# Patient Record
Sex: Female | Born: 1950 | Race: White | Hispanic: No | Smoking: Former smoker
Health system: Southern US, Community
[De-identification: ages and names within clinical notes are randomized; demographics above are authoritative.]

## PROBLEM LIST (undated history)

## (undated) DIAGNOSIS — A64 Unspecified sexually transmitted disease: Secondary | ICD-10-CM

## (undated) DIAGNOSIS — M1990 Unspecified osteoarthritis, unspecified site: Secondary | ICD-10-CM

## (undated) DIAGNOSIS — I350 Nonrheumatic aortic (valve) stenosis: Secondary | ICD-10-CM

## (undated) DIAGNOSIS — R32 Unspecified urinary incontinence: Secondary | ICD-10-CM

## (undated) DIAGNOSIS — B009 Herpesviral infection, unspecified: Secondary | ICD-10-CM

## (undated) DIAGNOSIS — R011 Cardiac murmur, unspecified: Secondary | ICD-10-CM

## (undated) DIAGNOSIS — K219 Gastro-esophageal reflux disease without esophagitis: Secondary | ICD-10-CM

## (undated) DIAGNOSIS — T7840XA Allergy, unspecified, initial encounter: Secondary | ICD-10-CM

## (undated) DIAGNOSIS — Z9189 Other specified personal risk factors, not elsewhere classified: Secondary | ICD-10-CM

## (undated) DIAGNOSIS — E119 Type 2 diabetes mellitus without complications: Secondary | ICD-10-CM

## (undated) DIAGNOSIS — I10 Essential (primary) hypertension: Secondary | ICD-10-CM

## (undated) DIAGNOSIS — C801 Malignant (primary) neoplasm, unspecified: Secondary | ICD-10-CM

## (undated) HISTORY — DX: Unspecified urinary incontinence: R32

## (undated) HISTORY — DX: Malignant (primary) neoplasm, unspecified: C80.1

## (undated) HISTORY — DX: Type 2 diabetes mellitus without complications: E11.9

## (undated) HISTORY — DX: Unspecified sexually transmitted disease: A64

## (undated) HISTORY — PX: COSMETIC SURGERY: SHX468

## (undated) HISTORY — DX: Unspecified osteoarthritis, unspecified site: M19.90

## (undated) HISTORY — DX: Allergy, unspecified, initial encounter: T78.40XA

## (undated) HISTORY — DX: Other specified personal risk factors, not elsewhere classified: Z91.89

## (undated) HISTORY — PX: CARDIAC CATHETERIZATION: SHX172

## (undated) HISTORY — PX: SKIN CANCER EXCISION: SHX779

## (undated) HISTORY — DX: Essential (primary) hypertension: I10

## (undated) HISTORY — DX: Nonrheumatic aortic (valve) stenosis: I35.0

## (undated) HISTORY — DX: Gastro-esophageal reflux disease without esophagitis: K21.9

## (undated) HISTORY — DX: Cardiac murmur, unspecified: R01.1

## (undated) HISTORY — DX: Herpesviral infection, unspecified: B00.9

## (undated) HISTORY — PX: COLONOSCOPY: SHX174

---

## 1978-07-16 HISTORY — PX: OTHER SURGICAL HISTORY: SHX169

## 1982-11-20 HISTORY — PX: PELVIC LAPAROSCOPY: SHX162

## 1991-11-24 HISTORY — PX: ENDOMETRIAL BIOPSY: SHX622

## 1993-11-20 HISTORY — PX: CYSTOSCOPY W/ DILATION OF BLADDER: SUR374

## 1998-07-19 ENCOUNTER — Other Ambulatory Visit: Admission: RE | Admit: 1998-07-19 | Discharge: 1998-07-19 | Payer: Self-pay | Admitting: Obstetrics and Gynecology

## 1999-08-01 ENCOUNTER — Other Ambulatory Visit: Admission: RE | Admit: 1999-08-01 | Discharge: 1999-08-01 | Payer: Self-pay | Admitting: Obstetrics and Gynecology

## 1999-12-05 ENCOUNTER — Encounter (INDEPENDENT_AMBULATORY_CARE_PROVIDER_SITE_OTHER): Payer: Self-pay | Admitting: Specialist

## 1999-12-05 ENCOUNTER — Other Ambulatory Visit: Admission: RE | Admit: 1999-12-05 | Discharge: 1999-12-05 | Payer: Self-pay | Admitting: Obstetrics and Gynecology

## 2000-07-31 ENCOUNTER — Other Ambulatory Visit: Admission: RE | Admit: 2000-07-31 | Discharge: 2000-07-31 | Payer: Self-pay | Admitting: Obstetrics and Gynecology

## 2001-08-06 ENCOUNTER — Other Ambulatory Visit: Admission: RE | Admit: 2001-08-06 | Discharge: 2001-08-06 | Payer: Self-pay | Admitting: Obstetrics and Gynecology

## 2002-08-18 ENCOUNTER — Other Ambulatory Visit: Admission: RE | Admit: 2002-08-18 | Discharge: 2002-08-18 | Payer: Self-pay | Admitting: Obstetrics and Gynecology

## 2003-11-03 ENCOUNTER — Other Ambulatory Visit: Admission: RE | Admit: 2003-11-03 | Discharge: 2003-11-03 | Payer: Self-pay | Admitting: Obstetrics and Gynecology

## 2004-02-15 ENCOUNTER — Encounter (INDEPENDENT_AMBULATORY_CARE_PROVIDER_SITE_OTHER): Payer: Self-pay | Admitting: Specialist

## 2004-02-15 ENCOUNTER — Ambulatory Visit (HOSPITAL_COMMUNITY): Admission: RE | Admit: 2004-02-15 | Discharge: 2004-02-15 | Payer: Self-pay | Admitting: *Deleted

## 2004-04-11 ENCOUNTER — Encounter: Admission: RE | Admit: 2004-04-11 | Discharge: 2004-04-11 | Payer: Self-pay | Admitting: Orthopedic Surgery

## 2004-12-06 ENCOUNTER — Other Ambulatory Visit: Admission: RE | Admit: 2004-12-06 | Discharge: 2004-12-06 | Payer: Self-pay | Admitting: Obstetrics and Gynecology

## 2005-12-12 ENCOUNTER — Other Ambulatory Visit: Admission: RE | Admit: 2005-12-12 | Discharge: 2005-12-12 | Payer: Self-pay | Admitting: Obstetrics and Gynecology

## 2007-01-09 ENCOUNTER — Other Ambulatory Visit: Admission: RE | Admit: 2007-01-09 | Discharge: 2007-01-09 | Payer: Self-pay | Admitting: Obstetrics and Gynecology

## 2008-01-22 ENCOUNTER — Other Ambulatory Visit: Admission: RE | Admit: 2008-01-22 | Discharge: 2008-01-22 | Payer: Self-pay | Admitting: Obstetrics and Gynecology

## 2009-02-15 ENCOUNTER — Encounter: Payer: Self-pay | Admitting: Obstetrics and Gynecology

## 2009-02-15 ENCOUNTER — Other Ambulatory Visit: Admission: RE | Admit: 2009-02-15 | Discharge: 2009-02-15 | Payer: Self-pay | Admitting: Obstetrics and Gynecology

## 2009-02-15 ENCOUNTER — Ambulatory Visit: Payer: Self-pay | Admitting: Obstetrics and Gynecology

## 2009-11-01 ENCOUNTER — Encounter: Admission: RE | Admit: 2009-11-01 | Discharge: 2009-11-01 | Payer: Self-pay | Admitting: Internal Medicine

## 2010-02-16 ENCOUNTER — Other Ambulatory Visit: Admission: RE | Admit: 2010-02-16 | Discharge: 2010-02-16 | Payer: Self-pay | Admitting: Obstetrics and Gynecology

## 2010-02-16 ENCOUNTER — Ambulatory Visit: Payer: Self-pay | Admitting: Obstetrics and Gynecology

## 2010-03-31 ENCOUNTER — Ambulatory Visit: Payer: Self-pay | Admitting: Obstetrics and Gynecology

## 2011-02-20 ENCOUNTER — Other Ambulatory Visit: Payer: Self-pay | Admitting: Obstetrics and Gynecology

## 2011-02-20 ENCOUNTER — Other Ambulatory Visit (HOSPITAL_COMMUNITY)
Admission: RE | Admit: 2011-02-20 | Discharge: 2011-02-20 | Disposition: A | Discharge status: Home / Self Care | Payer: BC Managed Care – PPO | Source: Ambulatory Visit | Attending: Obstetrics and Gynecology | Admitting: Obstetrics and Gynecology

## 2011-02-20 ENCOUNTER — Encounter (INDEPENDENT_AMBULATORY_CARE_PROVIDER_SITE_OTHER): Payer: BC Managed Care – PPO | Admitting: Obstetrics and Gynecology

## 2011-02-20 DIAGNOSIS — Z01419 Encounter for gynecological examination (general) (routine) without abnormal findings: Secondary | ICD-10-CM

## 2011-02-20 DIAGNOSIS — Z124 Encounter for screening for malignant neoplasm of cervix: Secondary | ICD-10-CM | POA: Insufficient documentation

## 2011-02-23 DIAGNOSIS — Z1211 Encounter for screening for malignant neoplasm of colon: Secondary | ICD-10-CM

## 2011-04-07 NOTE — Op Note (Signed)
NAME:  Mary Reed, Mary Reed                      ACCOUNT NO.:  [id number]   MEDICAL RECORD NO.:  [id number]                   PATIENT TYPE:  AMB   LOCATION:  ENDO                                 FACILITY:  [facility]   PHYSICIAN:  Georgiana Spinner, M.D.                 DATE OF BIRTH:  05/09/1951   DATE OF PROCEDURE:  DATE OF DISCHARGE:                                 OPERATIVE REPORT   PROCEDURE:  Upper endoscopy.   INDICATIONS:  GERD with dysphagia.   ANESTHESIA:  Demerol 60 mg, Versed 6 mg.   DESCRIPTION OF PROCEDURE:  With the patient mildly sedated in the left  lateral decubitus position, the Olympus videoscopic endoscope was inserted  in the mouth and passed under direct vision through the esophagus, which  appeared normal until it reached the distal esophagus which appeared mildly  inflamed.  This was photographed and biopsied.  We entered  into the  stomach.  The  fundus, body, antrum, duodenal bulb, and second portion of  the duodenum were visualized.  From this point, the endoscope was slowly  withdrawn taking circumferential views of the duodenal mucosa until the  endoscope was pulled back into the stomach, placed in retroflexion and  viewed the stomach from below.  The endoscope was then straightened,  withdrawn, taking circumferential views of the remaining gastric and  esophageal mucosa.  The patient's vital signs and pulse oximetry remained  stable.  The patient tolerated the procedure well without apparent  complication.   FINDINGS:  Distal esophagus inflamed, biopsied.   PLAN:  Since the patient's dysphagia is improving on the PPI, we will  continue that for the time being and have the patient follow up with me as  an outpatient.  Proceed to colonoscopy as planned.                                               Georgiana Spinner, M.D.    GMO/MEDQ  D:  02/15/2004  T:  02/15/2004  Job:  [id number]   cc:   Kingsley Callander. Ouida Sills, M.D.  [address]  [place]  [place] [address]  Fax: [phone]

## 2011-04-07 NOTE — Op Note (Signed)
NAME:  Mary Reed, Mary Reed                      ACCOUNT NO.:  [id number]   MEDICAL RECORD NO.:  [id number]                   PATIENT TYPE:  AMB   LOCATION:  ENDO                                 FACILITY:  [facility]   PHYSICIAN:  Georgiana Spinner, M.D.                 DATE OF BIRTH:  [birth year]   DATE OF PROCEDURE:  02/15/2004  DATE OF DISCHARGE:                                 OPERATIVE REPORT   PROCEDURE:  Colonoscopy.   INDICATIONS:  Rectal bleeding.   ANESTHESIA:  Demerol 20 mg.   DESCRIPTION OF PROCEDURE:  With the patient mildly sedated in the left  lateral decubitus position, a rectal exam was performed which was  unremarkable.  Subsequently, the Olympus videoscopic colonoscope was  inserted in the rectum, passed under direct vision to the cecum, identified  by the ileocecal valve and base of the cecum, both of which were  photographed.  From this point, the colonoscope was slowly withdrawn, taking  circumferential views of the colonic mucosa, stopping only then to  photograph the rectum which appeared normal on direct and showed hemorrhoids  on retroflexed view.  The endoscope was straightened and withdrawn.  The  patient's vital signs and pulse oximeter remained stable.  The patient  tolerated the procedure well without apparent complications.   FINDINGS:  1. Internal hemorrhoids.  2. Thickening of the sigmoid colon due to diverticular disease.  3. Otherwise, an unremarkable colonoscopic examination.   PLAN:  1. Increase the fiber in the patient's diet.  2. Have patient follow up with me, as described above in endoscopy note.                                               Georgiana Spinner, M.D.    GMO/MEDQ  D:  02/15/2004  T:  02/15/2004  Job:  [id number]   cc:   Reuel Boom L. Eda Paschal, M.D.  [address], [address]  [place]  [place] [address]  Fax: [phone]

## 2012-02-15 DIAGNOSIS — B009 Herpesviral infection, unspecified: Secondary | ICD-10-CM | POA: Insufficient documentation

## 2012-02-15 DIAGNOSIS — I10 Essential (primary) hypertension: Secondary | ICD-10-CM | POA: Insufficient documentation

## 2012-02-26 ENCOUNTER — Encounter: Payer: Self-pay | Admitting: Obstetrics and Gynecology

## 2012-02-26 ENCOUNTER — Other Ambulatory Visit (HOSPITAL_COMMUNITY)
Admission: RE | Admit: 2012-02-26 | Discharge: 2012-02-26 | Disposition: A | Discharge status: Home / Self Care | Payer: BC Managed Care – PPO | Source: Ambulatory Visit | Attending: Obstetrics and Gynecology | Admitting: Obstetrics and Gynecology

## 2012-02-26 ENCOUNTER — Ambulatory Visit (INDEPENDENT_AMBULATORY_CARE_PROVIDER_SITE_OTHER): Payer: BC Managed Care – PPO | Admitting: Obstetrics and Gynecology

## 2012-02-26 VITALS — BP 120/76 | Ht 69.0 in | Wt 188.0 lb

## 2012-02-26 DIAGNOSIS — Z01419 Encounter for gynecological examination (general) (routine) without abnormal findings: Secondary | ICD-10-CM

## 2012-02-26 NOTE — Progress Notes (Signed)
Patient came to see me today for her annual GYN exam. She has scheduled her mammogram. She has had 3 normal bone densities. She is not having significant menopausal symptoms. She does her lab work elsewhere. She is having no vaginal bleeding. She is having no pelvic pain.  HEENT: Within normal limits. Kennon Portela present. Neck: No masses. Supraclavicular lymph nodes: Not enlarged. Breasts: Examined in both sitting and lying position. Symmetrical without skin changes or masses. Abdomen: Soft no masses guarding or rebound. No hernias. Pelvic: External within normal limits. BUS within normal limits. Vaginal examination shows good estrogen effect, no cystocele enterocele or rectocele. Cervix: clean. Uterus: normal size and shape. Adnexa within normal limits. Rectovaginal confirmatory. Extremities within normal limits.  Assessment: Normal GYN exam  Plan: Mammogram.

## 2012-02-27 LAB — URINALYSIS W MICROSCOPIC + REFLEX CULTURE
Bacteria, UA: NONE SEEN
Bilirubin Urine: NEGATIVE
Casts: NONE SEEN
Crystals: NONE SEEN
Glucose, UA: NEGATIVE mg/dL
Hgb urine dipstick: NEGATIVE
Ketones, ur: NEGATIVE mg/dL
Nitrite: NEGATIVE
Protein, ur: NEGATIVE mg/dL
Specific Gravity, Urine: 1.011 (ref 1.005–1.030)
Squamous Epithelial / LPF: NONE SEEN
Urobilinogen, UA: 0.2 mg/dL (ref 0.0–1.0)
pH: 7 (ref 5.0–8.0)

## 2012-02-28 LAB — URINE CULTURE: Colony Count: 15000

## 2012-03-29 ENCOUNTER — Encounter: Payer: Self-pay | Admitting: Obstetrics and Gynecology

## 2013-03-05 ENCOUNTER — Encounter: Payer: Self-pay | Admitting: Gynecology

## 2013-03-26 ENCOUNTER — Encounter: Payer: Self-pay | Admitting: Gynecology

## 2013-03-26 ENCOUNTER — Other Ambulatory Visit (HOSPITAL_COMMUNITY)
Admission: RE | Admit: 2013-03-26 | Discharge: 2013-03-26 | Disposition: A | Discharge status: Home / Self Care | Payer: BC Managed Care – PPO | Source: Ambulatory Visit | Attending: Gynecology | Admitting: Gynecology

## 2013-03-26 ENCOUNTER — Ambulatory Visit (INDEPENDENT_AMBULATORY_CARE_PROVIDER_SITE_OTHER): Payer: BC Managed Care – PPO | Admitting: Gynecology

## 2013-03-26 VITALS — BP 146/90 | Ht 68.75 in | Wt 190.0 lb

## 2013-03-26 DIAGNOSIS — Z23 Encounter for immunization: Secondary | ICD-10-CM

## 2013-03-26 DIAGNOSIS — Z01419 Encounter for gynecological examination (general) (routine) without abnormal findings: Secondary | ICD-10-CM | POA: Insufficient documentation

## 2013-03-26 DIAGNOSIS — Z91B Personal risk factor of exposure to diethylstilbestrol: Secondary | ICD-10-CM | POA: Insufficient documentation

## 2013-03-26 DIAGNOSIS — E119 Type 2 diabetes mellitus without complications: Secondary | ICD-10-CM | POA: Insufficient documentation

## 2013-03-26 DIAGNOSIS — N952 Postmenopausal atrophic vaginitis: Secondary | ICD-10-CM

## 2013-03-26 DIAGNOSIS — N959 Unspecified menopausal and perimenopausal disorder: Secondary | ICD-10-CM

## 2013-03-26 DIAGNOSIS — Z9189 Other specified personal risk factors, not elsewhere classified: Secondary | ICD-10-CM

## 2013-03-26 DIAGNOSIS — N882 Stricture and stenosis of cervix uteri: Secondary | ICD-10-CM

## 2013-03-26 DIAGNOSIS — R635 Abnormal weight gain: Secondary | ICD-10-CM

## 2013-03-26 DIAGNOSIS — Z1151 Encounter for screening for human papillomavirus (HPV): Secondary | ICD-10-CM | POA: Insufficient documentation

## 2013-03-26 MED ORDER — VALACYCLOVIR HCL 500 MG PO TABS: ORAL_TABLET | ORAL | Status: DC

## 2013-03-26 NOTE — Progress Notes (Signed)
Mary Reed 08/06/1951 [id number]   History:    62 y.o.  for annual gyn exam with no complaints today. Review of patient's records and after speaking with her she was a DES daughter. Her mother had received a DES to prevent preterm delivery during her pregnancy. Patient has had normal Pap smears in the past. Her primary physician Dr. Renne Crigler has been doing her lab work in recently was diagnosed with type 2 diabetes and currently on metformin twice a day. Patient has received her shingles vaccine in the past but has not had to Tdap vaccine. Her mammogram in 2013 was normal but was dense. Her colonoscopy is overdue has been 10 years. She reports that last colonoscopy was normal.  Past medical history,surgical history, family history and social history were all reviewed and documented in the EPIC chart.  Gynecologic History No LMP recorded. Patient is postmenopausal. Contraception: post menopausal status Last Pap: 2013. Results were: normal Last mammogram: 2013. Results were: normal but since  Obstetric History OB History   Grav Para Term Preterm Abortions TAB SAB Ect Mult Living   2 2 2       2      # Outc Date GA Lbr Len/2nd Wgt Sex Del Anes PTL Lv   1 TRM            2 TRM                ROS: A ROS was performed and pertinent positives and negatives are included in the history.  GENERAL: No fevers or chills. HEENT: No change in vision, no earache, sore throat or sinus congestion. NECK: No pain or stiffness. CARDIOVASCULAR: No chest pain or pressure. No palpitations. PULMONARY: No shortness of breath, cough or wheeze. GASTROINTESTINAL: No abdominal pain, nausea, vomiting or diarrhea, melena or bright red blood per rectum. GENITOURINARY: No urinary frequency, urgency, hesitancy or dysuria. MUSCULOSKELETAL: No joint or muscle pain, no back pain, no recent trauma. DERMATOLOGIC: No rash, no itching, no lesions. ENDOCRINE: No polyuria, polydipsia, no heat or cold intolerance. No recent change  in weight. HEMATOLOGICAL: No anemia or easy bruising or bleeding. NEUROLOGIC: No headache, seizures, numbness, tingling or weakness. PSYCHIATRIC: No depression, no loss of interest in normal activity or change in sleep pattern.     Exam: chaperone present  BP 146/90  Ht 5' 8.75" (1.746 m)  Wt 190 lb (86.183 kg)  BMI 28.27 kg/m2  Body mass index is 28.27 kg/(m^2).  General appearance : Well developed well nourished female. No acute distress HEENT: Neck supple, trachea midline, no carotid bruits, no thyroidmegaly Lungs: Clear to auscultation, no rhonchi or wheezes, or rib retractions  Heart: Regular rate and rhythm, no murmurs or gallops Breast:Examined in sitting and supine position were symmetrical in appearance, no palpable masses or tenderness,  no skin retraction, no nipple inversion, no nipple discharge, no skin discoloration, no axillary or supraclavicular lymphadenopathy Abdomen: no palpable masses or tenderness, no rebound or guarding Extremities: no edema or skin discoloration or tenderness  Pelvic:  Bartholin, Urethra, Skene Glands: Within normal limits             Vagina: No gross lesions or discharge, atrophic changes  Cervix: No gross lesions or discharge, stenotic os  Uterus  anteverted, normal size, shape and consistency, non-tender and mobile  Adnexa  Without masses or tenderness  Anus and perineum  normal   Rectovaginal  normal sphincter tone without palpated masses or tenderness  Hemoccult card provided     Assessment/Plan:  61 y.o. 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 We discussed the new Pap smear guidelines. Because of her history we will do annual Pap smears. Due to the fact that patient's cervix was narrow after her Pap smear was obtained Betadine solution was applied. A paracervical block with 1% lidocaine was infiltrated into the cervical block at the 12,2, 4 and 8 position. Small wire dilators were used to dilate the cervical canal the  endocervical brush was utilized to get adequate sampling of the endocervical canal attached to the Pap smear. Patient was given the name of one of my colleagues Gastroenterologist for her to schedule an appointment. Hemoccult cards were provided. Patient will receive a Tdap vaccine today.patient with schedule her bone density study in the next few weeks. We discussed importance of calcium vitamin D and regular exercise for osteoporosis prevention. Patient was reminded to schedule her mammogram at which time it is recommended she have a three-dimensional due to her dense breasts.    Ok Edwards MD, 10:38 AM 03/26/2013

## 2013-03-26 NOTE — Patient Instructions (Addendum)
Exercise to Lose Weight Exercise and a healthy diet may help you lose weight. Your doctor may suggest specific exercises. EXERCISE IDEAS AND TIPS  Choose low-cost things you enjoy doing, such as walking, bicycling, or exercising to workout videos.   Take stairs instead of the elevator.   Walk during your lunch break.   Park your car further away from work or school.   Go to a gym or an exercise class.   Start with 5 to 10 minutes of exercise each day. Build up to 30 minutes of exercise 4 to 6 days a week.   Wear shoes with good support and comfortable clothes.   Stretch before and after working out.   Work out until you breathe harder and your heart beats faster.   Drink extra water when you exercise.   Do not do so much that you hurt yourself, feel dizzy, or get very short of breath.  Exercises that burn about 150 calories:  Running 1  miles in 15 minutes.   Playing volleyball for 45 to 60 minutes.   Washing and waxing a car for 45 to 60 minutes.   Playing touch football for 45 minutes.   Walking 1  miles in 35 minutes.   Pushing a stroller 1  miles in 30 minutes.   Playing basketball for 30 minutes.   Raking leaves for 30 minutes.   Bicycling 5 miles in 30 minutes.   Walking 2 miles in 30 minutes.   Dancing for 30 minutes.   Shoveling snow for 15 minutes.   Swimming laps for 20 minutes.   Walking up stairs for 15 minutes.   Bicycling 4 miles in 15 minutes.   Gardening for 30 to 45 minutes.   Jumping rope for 15 minutes.   Washing windows or floors for 45 to 60 minutes.  Document Released: 12/09/2010 Document Revised: 07/19/2011 Document Reviewed: 12/09/2010 [facility] Patient Information 2012 [place].                                          Patient information: High cholesterol (The Basics)  What is cholesterol? - Cholesterol is a substance that is found in the blood. Everyone has some. It is needed for good health. The problem is, people  sometimes have too much cholesterol. Compared with people with normal cholesterol, people with high cholesterol have a higher risk of heart attacks, strokes, and other health problems. The higher your cholesterol, the higher your risk of these problems. Cholesterol levels in your body are determined significantly by your diet. Cholesterol levels may also be related to heart disease. The following material helps to explain this relationship and discusses what you can do to help keep your heart healthy. Not all cholesterol is bad. Low-density lipoprotein (LDL) cholesterol is the "bad" cholesterol. It may cause fatty deposits to build up inside your arteries. High-density lipoprotein (HDL) cholesterol is "good." It helps to remove the "bad" LDL cholesterol from your blood. Cholesterol is a very important risk factor for heart disease. Other risk factors are high blood pressure, smoking, stress, heredity, and weight.  The heart muscle gets its supply of blood through the coronary arteries. If your LDL cholesterol is high and your HDL cholesterol is low, you are at risk for having fatty deposits build up in your coronary arteries. This leaves less room through which blood can flow. Without sufficient blood and  oxygen, the heart muscle cannot function properly and you may feel chest pains (angina pectoris). When a coronary artery closes up entirely, a part of the heart muscle may die, causing a heart attack (myocardial infarction).  CHECKING CHOLESTEROL When your caregiver sends your blood to a lab to be analyzed for cholesterol, a complete lipid (fat) profile may be done. With this test, the total amount of cholesterol and levels of LDL and HDL are determined. Triglycerides are a type of fat that circulates in the blood and can also be used to determine heart disease risk. Are there different types of cholesterol? - Yes, there are a few different types. If you get a cholesterol test, you may hear your doctor or  nurse talk about: Total cholesterol  LDL cholesterol - Some people call this the "bad" cholesterol. That's because having high LDL levels raises your risk of heart attacks, strokes, and other health problems.  HDL cholesterol - Some people call this the "good" cholesterol. That's because having high HDL levels lowers your risk of heart attacks, strokes, and other health problems.  Non-HDL cholesterol - Non-HDL cholesterol is your total cholesterol minus your HDL cholesterol.  Triglycerides - Triglycerides are not cholesterol. They are a type of fat. But they often get measured when cholesterol is measured. (Having high triglycerides also seems to increase the risk of heart attacks and strokes.)   Keep in mind, though, that many people who cannot meet these goals still have a low risk of heart attacks and strokes. What should I do if my doctor tells me I have high cholesterol? - Ask your doctor what your overall risk of heart attacks and strokes is. High cholesterol, by itself, is not always a reason to worry. Having high cholesterol is just one of many things that can increase your risk of heart attacks and strokes. Other factors that increase your risk include:  Cigarette smoking  High blood pressure  Having a parent, sister, or brother who got heart disease at a young age (Young, in this case, means younger than 55 for men and younger than 65 for women.)  Being a man (Women are at risk, too, but men have a higher risk.)  Older age  If you are at high risk of heart attacks and strokes, having high cholesterol is a problem. On the other hand, if you have are at low risk, having high cholesterol may not mean much. Should I take medicine to lower cholesterol? - Not everyone who has high cholesterol needs medicines. Your doctor or nurse will decide if you need them based on your age, family history, and other health concerns.  You should probably take a cholesterol-lowering medicine called a statin if  you: Already had a heart attack or stroke  Have known heart disease  Have diabetes  Have a condition called peripheral artery disease, which makes it painful to walk, and happens when the arteries in your legs get clogged with fatty deposits  Have an abdominal aortic aneurysm, which is a widening of the main artery in the belly  Most people with any of the conditions listed above should take a statin no matter what their cholesterol level is. If your doctor or nurse puts you on a statin, stay on it. The medicine may not make you feel any different. But it can help prevent heart attacks, strokes, and death.  Can I lower my cholesterol without medicines? - Yes, you can lower your cholesterol some by:  Avoiding red meat, butter,   fried foods, cheese, and other foods that have a lot of saturated fat  Losing weight (if you are overweight)  Being more active Even if these steps do little to change your cholesterol, they can improve your health in many ways.                                                   Cholesterol Control Diet  CONTROLLING CHOLESTEROL WITH DIET Although exercise and lifestyle factors are important, your diet is key. That is because certain foods are known to raise cholesterol and others to lower it. The goal is to balance foods for their effect on cholesterol and more importantly, to replace saturated and trans fat with other types of fat, such as monounsaturated fat, polyunsaturated fat, and omega-3 fatty acids. On average, a person should consume no more than 15 to 17 g of saturated fat daily. Saturated and trans fats are considered "bad" fats, and they will raise LDL cholesterol. Saturated fats are primarily found in animal products such as meats, butter, and cream. However, that does not mean you need to sacrifice all your favorite foods. Today, there are good tasting, low-fat, low-cholesterol substitutes for most of the things you like to eat. Choose low-fat or nonfat  alternatives. Choose round or loin cuts of red meat, since these types of cuts are lowest in fat and cholesterol. Chicken (without the skin), fish, veal, and ground [place] breast are excellent choices. Eliminate fatty meats, such as hot dogs and salami. Even shellfish have little or no saturated fat. Have a 3 oz (85 g) portion when you eat lean meat, poultry, or fish. Trans fats are also called "partially hydrogenated oils." They are oils that have been scientifically manipulated so that they are solid at room temperature resulting in a longer shelf life and improved taste and texture of foods in which they are added. Trans fats are found in stick margarine, some tub margarines, cookies, crackers, and baked goods.  When baking and cooking, oils are an excellent substitute for butter. The monounsaturated oils are especially beneficial since it is believed they lower LDL and raise HDL. The oils you should avoid entirely are saturated tropical oils, such as coconut and palm.  Remember to eat liberally from food groups that are naturally free of saturated and trans fat, including fish, fruit, vegetables, beans, grains (barley, rice, couscous, bulgur wheat), and pasta (without cream sauces).  IDENTIFYING FOODS THAT LOWER CHOLESTEROL  Soluble fiber may lower your cholesterol. This type of fiber is found in fruits such as apples, vegetables such as broccoli, potatoes, and carrots, legumes such as beans, peas, and lentils, and grains such as barley. Foods fortified with plant sterols (phytosterol) may also lower cholesterol. You should eat at least 2 g per day of these foods for a cholesterol lowering effect.  Read package labels to identify low-saturated fats, trans fats free, and low-fat foods at the supermarket. Select cheeses that have only 2 to 3 g saturated fat per ounce. Use a heart-healthy tub margarine that is free of trans fats or partially hydrogenated oil. When buying baked goods (cookies, crackers), avoid  partially hydrogenated oils. Breads and muffins should be made from whole grains (whole-wheat or whole oat flour, instead of "flour" or "enriched flour"). Buy non-creamy canned soups with reduced salt and no added fats.  FOOD PREPARATION TECHNIQUES  Never deep-fry. If you must fry, either stir-fry, which uses very little fat, or use non-stick cooking sprays. When possible, broil, bake, or roast meats, and steam vegetables. Instead of dressing vegetables with butter or margarine, use lemon and herbs, applesauce and cinnamon (for squash and sweet potatoes), nonfat yogurt, salsa, and low-fat dressings for salads.  LOW-SATURATED FAT / LOW-FAT FOOD SUBSTITUTES Meats / Saturated Fat (g)  Avoid: Steak, marbled (3 oz/85 g) / 11 g   Choose: Steak, lean (3 oz/85 g) / 4 g   Avoid: Hamburger (3 oz/85 g) / 7 g   Choose: Hamburger, lean (3 oz/85 g) / 5 g   Avoid: Ham (3 oz/85 g) / 6 g   Choose: Ham, lean cut (3 oz/85 g) / 2.4 g   Avoid: Chicken, with skin, dark meat (3 oz/85 g) / 4 g   Choose: Chicken, skin removed, dark meat (3 oz/85 g) / 2 g   Avoid: Chicken, with skin, light meat (3 oz/85 g) / 2.5 g   Choose: Chicken, skin removed, light meat (3 oz/85 g) / 1 g  Dairy / Saturated Fat (g)  Avoid: Whole milk (1 cup) / 5 g   Choose: Low-fat milk, 2% (1 cup) / 3 g   Choose: Low-fat milk, 1% (1 cup) / 1.5 g   Choose: Skim milk (1 cup) / 0.3 g   Avoid: Hard cheese (1 oz/28 g) / 6 g   Choose: Skim milk cheese (1 oz/28 g) / 2 to 3 g   Avoid: Cottage cheese, 4% fat (1 cup) / 6.5 g   Choose: Low-fat cottage cheese, 1% fat (1 cup) / 1.5 g   Avoid: Ice cream (1 cup) / 9 g   Choose: Sherbet (1 cup) / 2.5 g   Choose: Nonfat frozen yogurt (1 cup) / 0.3 g   Choose: Frozen fruit bar / trace   Avoid: Whipped cream (1 tbs) / 3.5 g   Choose: Nondairy whipped topping (1 tbs) / 1 g  Condiments / Saturated Fat (g)  Avoid: Mayonnaise (1 tbs) / 2 g   Choose: Low-fat mayonnaise (1 tbs) / 1 g    Avoid: Butter (1 tbs) / 7 g   Choose: Extra light margarine (1 tbs) / 1 g   Avoid: Coconut oil (1 tbs) / 11.8 g   Choose: Olive oil (1 tbs) / 1.8 g   Choose: Corn oil (1 tbs) / 1.7 g   Choose: Safflower oil (1 tbs) / 1.2 g   Choose: Sunflower oil (1 tbs) / 1.4 g   Choose: Soybean oil (1 tbs) / 2.4 g   Choose: Canola oil (1 tbs) / 1 g   Tetanus, Diphtheria, Pertussis (Tdap) Vaccine What You Need to Know WHY GET VACCINATED? Tetanus, diphtheria and pertussis can be very serious diseases, even for adolescents and adults. Tdap vaccine can protect [place] from these diseases. TETANUS (Lockjaw) causes painful muscle tightening and stiffness, usually all over the body.  It can lead to tightening of muscles in the head and neck so you can't open your mouth, swallow, or sometimes even breathe. Tetanus kills about 1 out of 5 people who are infected. DIPHTHERIA can cause a thick coating to form in the back of the throat.  It can lead to breathing problems, paralysis, heart failure, and death. PERTUSSIS (Whooping Cough) causes severe coughing spells, which can cause difficulty breathing, vomiting and disturbed sleep.  It can also lead to weight loss, incontinence, and rib fractures. Up to  2 in 100 adolescents and 5 in 100 adults with pertussis are hospitalized or have complications, which could include pneumonia and death. These diseases are caused by bacteria. Diphtheria and pertussis are spread from person to person through coughing or sneezing. Tetanus enters the body through cuts, scratches, or wounds. Before vaccines, the [place] saw as many as 200,000 cases a year of diphtheria and pertussis, and hundreds of cases of tetanus. Since vaccination began, tetanus and diphtheria have dropped by about 99% and pertussis by about 80%. TDAP VACCINE Tdap vaccine can protect adolescents and adults from tetanus, diphtheria, and pertussis. One dose of Tdap is routinely given at age 78 or 64. People  who did not get Tdap at that age should get it as soon as possible. Tdap is especially important for health care professionals and anyone having close contact with a baby younger than 12 months. Pregnant women should get a dose of Tdap during every pregnancy, to protect the newborn from pertussis. Infants are most at risk for severe, life-threatening complications from pertussis. A similar vaccine, called Td, protects from tetanus and diphtheria, but not pertussis. A Td booster should be given every 10 years. Tdap may be given as one of these boosters if you have not already gotten a dose. Tdap may also be given after a severe cut or burn to prevent tetanus infection. Your doctor can give you more information. Tdap may safely be given at the same time as other vaccines. SOME PEOPLE SHOULD NOT GET THIS VACCINE  If you ever had a life-threatening allergic reaction after a dose of any tetanus, diphtheria, or pertussis containing vaccine, OR if you have a severe allergy to any part of this vaccine, you should not get Tdap. Tell your doctor if you have any severe allergies.  If you had a coma, or long or multiple seizures within 7 days after a childhood dose of DTP or DTaP, you should not get Tdap, unless a cause other than the vaccine was found. You can still get Td.  Talk to your doctor if you:  have epilepsy or another nervous system problem,  had severe pain or swelling after any vaccine containing diphtheria, tetanus or pertussis,  ever had Guillain-Barr Syndrome (GBS),  aren't feeling well on the day the shot is scheduled. RISKS OF A VACCINE REACTION With any medicine, including vaccines, there is a chance of side effects. These are usually mild and go away on their own, but serious reactions are also possible. Brief fainting spells can follow a vaccination, leading to injuries from falling. Sitting or lying down for about 15 minutes can help prevent these. Tell your doctor if you feel dizzy  or light-headed, or have vision changes or ringing in the ears. Mild problems following Tdap (Did not interfere with activities)  Pain where the shot was given (about 3 in 4 adolescents or 2 in 3 adults)  Redness or swelling where the shot was given (about 1 person in 5)  Mild fever of at least 100.71F (up to about 1 in 25 adolescents or 1 in 100 adults)  Headache (about 3 or 4 people in 10)  Tiredness (about 1 person in 3 or 4)  Nausea, vomiting, diarrhea, stomach ache (up to 1 in 4 adolescents or 1 in 10 adults)  Chills, body aches, sore joints, rash, swollen glands (uncommon) Moderate problems following Tdap (Interfered with activities, but did not require medical attention)  Pain where the shot was given (about 1 in 5 adolescents or  1 in 100 adults)  Redness or swelling where the shot was given (up to about 1 in 16 adolescents or 1 in 25 adults)  Fever over 102F (about 1 in 100 adolescents or 1 in 250 adults)  Headache (about 3 in 20 adolescents or 1 in 10 adults)  Nausea, vomiting, diarrhea, stomach ache (up to 1 or 3 people in 100)  Swelling of the entire arm where the shot was given (up to about 3 in 100). Severe problems following Tdap (Unable to perform usual activities, required medical attention)  Swelling, severe pain, bleeding and redness in the arm where the shot was given (rare). A severe allergic reaction could occur after any vaccine (estimated less than 1 in a million doses). WHAT IF THERE IS A SERIOUS REACTION? What should I look for?  Look for anything that concerns you, such as signs of a severe allergic reaction, very high fever, or behavior changes. Signs of a severe allergic reaction can include hives, swelling of the face and throat, difficulty breathing, a fast heartbeat, dizziness, and weakness. These would start a few minutes to a few hours after the vaccination. What should I do?  If you think it is a severe allergic reaction or other emergency  that can't wait, call 9-1-1 or get the person to the nearest hospital. Otherwise, call your doctor.  Afterward, the reaction should be reported to the "Vaccine Adverse Event Reporting System" (VAERS). Your doctor might file this report, or you can do it yourself through the VAERS web site at www.vaers.LAgents.no, or by calling 1-3081513457. VAERS is only for reporting reactions. They do not give medical advice.  THE NATIONAL VACCINE INJURY COMPENSATION PROGRAM The National Vaccine Injury Compensation Program (VICP) is a federal program that was created to compensate people who may have been injured by certain vaccines. Persons who believe they may have been injured by a vaccine can learn about the program and about filing a claim by calling 1-7093955384 or visiting the VICP website at SpiritualWord.at. HOW CAN I LEARN MORE?  Ask your doctor.  Call your local or state health department.  Contact the Centers for Disease Control and Prevention (CDC):  Call 956-648-5509 or visit CDC's website at PicCapture.uy CDC Tdap Vaccine VIS (03/28/12) Document Released: 05/07/2012 Document Reviewed: 05/07/2012 [facility] Patient Information 2013 [place], [place].

## 2013-03-26 NOTE — Addendum Note (Signed)
Addended by: Bertram Savin A on: 03/26/2013 10:53 AM   Modules accepted: Orders

## 2013-03-27 ENCOUNTER — Encounter: Payer: Self-pay | Admitting: Obstetrics and Gynecology

## 2013-04-02 ENCOUNTER — Encounter: Payer: Self-pay | Admitting: Gynecology

## 2013-07-23 DIAGNOSIS — Z1211 Encounter for screening for malignant neoplasm of colon: Secondary | ICD-10-CM

## 2013-07-24 ENCOUNTER — Other Ambulatory Visit: Payer: BC Managed Care – PPO | Admitting: Anesthesiology

## 2013-07-24 DIAGNOSIS — Z1211 Encounter for screening for malignant neoplasm of colon: Secondary | ICD-10-CM

## 2013-09-25 ENCOUNTER — Other Ambulatory Visit: Payer: Self-pay

## 2014-03-16 ENCOUNTER — Encounter: Payer: Self-pay | Admitting: Obstetrics and Gynecology

## 2014-03-16 ENCOUNTER — Telehealth: Payer: Self-pay | Admitting: Obstetrics and Gynecology

## 2014-03-16 NOTE — Telephone Encounter (Signed)
lmtcb re: new rs'd appointment for same day, different time with Dr. Quincy Simmonds.

## 2014-04-08 ENCOUNTER — Encounter: Payer: BC Managed Care – PPO | Admitting: Obstetrics and Gynecology

## 2014-04-08 ENCOUNTER — Encounter: Payer: Self-pay | Admitting: Obstetrics and Gynecology

## 2014-04-08 ENCOUNTER — Ambulatory Visit (INDEPENDENT_AMBULATORY_CARE_PROVIDER_SITE_OTHER): Payer: BC Managed Care – PPO | Admitting: Obstetrics and Gynecology

## 2014-04-08 VITALS — BP 138/80 | HR 76 | Ht 68.25 in | Wt 185.0 lb

## 2014-04-08 DIAGNOSIS — Z01419 Encounter for gynecological examination (general) (routine) without abnormal findings: Secondary | ICD-10-CM

## 2014-04-08 DIAGNOSIS — Z1211 Encounter for screening for malignant neoplasm of colon: Secondary | ICD-10-CM

## 2014-04-08 DIAGNOSIS — Z9189 Other specified personal risk factors, not elsewhere classified: Secondary | ICD-10-CM

## 2014-04-08 DIAGNOSIS — Z0000 Encounter for general adult medical examination without abnormal findings: Secondary | ICD-10-CM

## 2014-04-08 LAB — POCT URINALYSIS DIPSTICK
Bilirubin, UA: NEGATIVE
Blood, UA: NEGATIVE
Glucose, UA: NEGATIVE
Ketones, UA: NEGATIVE
Leukocytes, UA: NEGATIVE
Nitrite, UA: NEGATIVE
Protein, UA: NEGATIVE
Urobilinogen, UA: NEGATIVE
pH, UA: 5

## 2014-04-08 NOTE — Patient Instructions (Signed)
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
## 2014-04-10 LAB — IPS PAP SMEAR ONLY

## 2014-04-10 LAB — IPS PAP TEST WITH HPV

## 2014-04-28 LAB — FECAL OCCULT BLOOD, IMMUNOCHEMICAL: IFOBT: NEGATIVE

## 2014-09-21 ENCOUNTER — Encounter: Payer: Self-pay | Admitting: Obstetrics and Gynecology

## 2015-04-21 ENCOUNTER — Ambulatory Visit (INDEPENDENT_AMBULATORY_CARE_PROVIDER_SITE_OTHER): Payer: Medicare HMO | Admitting: Obstetrics and Gynecology

## 2015-04-21 ENCOUNTER — Encounter: Payer: Self-pay | Admitting: Obstetrics and Gynecology

## 2015-04-21 VITALS — BP 140/84 | HR 64 | Resp 16 | Ht 68.0 in | Wt 189.4 lb

## 2015-04-21 DIAGNOSIS — Z01419 Encounter for gynecological examination (general) (routine) without abnormal findings: Secondary | ICD-10-CM

## 2015-04-21 MED ORDER — NYSTATIN 100000 UNIT/GM EX POWD: Freq: Three times a day (TID) | CUTANEOUS | Status: DC
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
## 2015-04-24 LAB — IPS PAP SMEAR ONLY

## 2015-04-26 ENCOUNTER — Ambulatory Visit: Payer: Self-pay | Admitting: Certified Nurse Midwife

## 2015-05-19 ENCOUNTER — Ambulatory Visit: Payer: Self-pay | Admitting: Sports Medicine

## 2015-12-14 ENCOUNTER — Other Ambulatory Visit: Payer: Self-pay | Admitting: Internal Medicine

## 2015-12-14 DIAGNOSIS — I6529 Occlusion and stenosis of unspecified carotid artery: Secondary | ICD-10-CM

## 2015-12-20 ENCOUNTER — Ambulatory Visit
Admission: RE | Admit: 2015-12-20 | Discharge: 2015-12-20 | Disposition: A | Discharge status: Home / Self Care | Payer: Commercial Managed Care - HMO | Source: Ambulatory Visit | Attending: Internal Medicine | Admitting: Internal Medicine

## 2015-12-20 DIAGNOSIS — I6529 Occlusion and stenosis of unspecified carotid artery: Secondary | ICD-10-CM

## 2016-04-24 ENCOUNTER — Ambulatory Visit (INDEPENDENT_AMBULATORY_CARE_PROVIDER_SITE_OTHER): Payer: Commercial Managed Care - HMO | Admitting: Obstetrics and Gynecology

## 2016-04-24 ENCOUNTER — Encounter: Payer: Self-pay | Admitting: Obstetrics and Gynecology

## 2016-04-24 VITALS — BP 122/68 | HR 60 | Resp 18 | Ht 69.0 in | Wt 189.6 lb

## 2016-04-24 DIAGNOSIS — Z01419 Encounter for gynecological examination (general) (routine) without abnormal findings: Secondary | ICD-10-CM | POA: Diagnosis not present

## 2016-04-24 MED ORDER — VALACYCLOVIR HCL 500 MG PO TABS: ORAL_TABLET | ORAL | Status: DC
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
## 2016-04-24 NOTE — Patient Instructions (Signed)
Health Maintenance, Female Adopting a healthy lifestyle and getting preventive care can go a long way to promote health and wellness. Talk with your health care Mary Reed about what schedule of regular examinations is right for you. This is a good chance for you to check in with your Mary Reed about disease prevention and staying healthy. In between checkups, there are plenty of things you can do on your own. Experts have done a lot of research about which lifestyle changes and preventive measures are most likely to keep you healthy. Ask your health care Mary Reed for more information. WEIGHT AND DIET  Eat a healthy diet  Be sure to include plenty of vegetables, fruits, low-fat dairy products, and lean protein.  Do not eat a lot of foods high in solid fats, added sugars, or salt.  Get regular exercise. This is one of the most important things you can do for your health.  Most adults should exercise for at least 150 minutes each week. The exercise should increase your heart rate and make you sweat (moderate-intensity exercise).  Most adults should also do strengthening exercises at least twice a week. This is in addition to the moderate-intensity exercise.  Maintain a healthy weight  Body mass index (BMI) is a measurement that can be used to identify possible weight problems. It estimates body fat based on height and weight. Your health care Mary Reed can help determine your BMI and help you achieve or maintain a healthy weight.  For females 20 years of age and older:   A BMI below 18.5 is considered underweight.  A BMI of 18.5 to 24.9 is normal.  A BMI of 25 to 29.9 is considered overweight.  A BMI of 30 and above is considered obese.  Watch levels of cholesterol and blood lipids  You should start having your blood tested for lipids and cholesterol at 67 years of age, then have this test every 5 years.  You may need to have your cholesterol levels checked more often if:  Your lipid  or cholesterol levels are high.  You are older than 67 years of age.  You are at high risk for heart disease.  CANCER SCREENING   Lung Cancer  Lung cancer screening is recommended for adults 55-80 years old who are at high risk for lung cancer because of a history of smoking.  A yearly low-dose CT scan of the lungs is recommended for people who:  Currently smoke.  Have quit within the past 15 years.  Have at least a 30-pack-year history of smoking. A pack year is smoking an average of one pack of cigarettes a day for 1 year.  Yearly screening should continue until it has been 15 years since you quit.  Yearly screening should stop if you develop a health problem that would prevent you from having lung cancer treatment.  Breast Cancer  Practice breast self-awareness. This means understanding how your breasts normally appear and feel.  It also means doing regular breast self-exams. Let your health care Mary Reed know about any changes, no matter how small.  If you are in your 20s or 30s, you should have a clinical breast exam (CBE) by a health care Mary Reed every 1-3 years as part of a regular health exam.  If you are 40 or older, have a CBE every year. Also consider having a breast X-ray (mammogram) every year.  If you have a family history of breast cancer, talk to your health care Mary Reed about genetic screening.  If you   are at high risk for breast cancer, talk to your health care Mary Reed about having an MRI and a mammogram every year.  Breast cancer gene (BRCA) assessment is recommended for women who have family members with BRCA-related cancers. BRCA-related cancers include:  Breast.  Ovarian.  Tubal.  Peritoneal cancers.  Results of the assessment will determine the need for genetic counseling and BRCA1 and BRCA2 testing. Cervical Cancer Your health care Mary Reed may recommend that you be screened regularly for cancer of the pelvic organs (ovaries, uterus, and  vagina). This screening involves a pelvic examination, including checking for microscopic changes to the surface of your cervix (Pap test). You may be encouraged to have this screening done every 3 years, beginning at age 21.  For women ages 30-65, health care providers may recommend pelvic exams and Pap testing every 3 years, or they may recommend the Pap and pelvic exam, combined with testing for human papilloma virus (HPV), every 5 years. Some types of HPV increase your risk of cervical cancer. Testing for HPV may also be done on women of any age with unclear Pap test results.  Other health care providers may not recommend any screening for nonpregnant women who are considered low risk for pelvic cancer and who do not have symptoms. Ask your health care Mary Reed if a screening pelvic exam is right for you.  If you have had past treatment for cervical cancer or a condition that could lead to cancer, you need Pap tests and screening for cancer for at least 20 years after your treatment. If Pap tests have been discontinued, your risk factors (such as having a new sexual partner) need to be reassessed to determine if screening should resume. Some women have medical problems that increase the chance of getting cervical cancer. In these cases, your health care Mary Reed may recommend more frequent screening and Pap tests. Colorectal Cancer  This type of cancer can be detected and often prevented.  Routine colorectal cancer screening usually begins at 67 years of age and continues through 67 years of age.  Your health care Mary Reed may recommend screening at an earlier age if you have risk factors for colon cancer.  Your health care Mary Reed may also recommend using home test kits to check for hidden blood in the stool.  A small camera at the end of a tube can be used to examine your colon directly (sigmoidoscopy or colonoscopy). This is done to check for the earliest forms of colorectal  cancer.  Routine screening usually begins at age 50.  Direct examination of the colon should be repeated every 5-10 years through 67 years of age. However, you may need to be screened more often if early forms of precancerous polyps or small growths are found. Skin Cancer  Check your skin from head to toe regularly.  Tell your health care Mary Reed about any new moles or changes in moles, especially if there is a change in a mole's shape or color.  Also tell your health care Mary Reed if you have a mole that is larger than the size of a pencil eraser.  Always use sunscreen. Apply sunscreen liberally and repeatedly throughout the day.  Protect yourself by wearing long sleeves, pants, a wide-brimmed hat, and sunglasses whenever you are outside. HEART DISEASE, DIABETES, AND HIGH BLOOD PRESSURE   High blood pressure causes heart disease and increases the risk of stroke. High blood pressure is more likely to develop in:  People who have blood pressure in the high end   of the normal range (130-139/85-89 mm Hg).  People who are overweight or obese.  People who are African American.  If you are 38-23 years of age, have your blood pressure checked every 3-5 years. If you are 61 years of age or older, have your blood pressure checked every year. You should have your blood pressure measured twice--once when you are at a hospital or clinic, and once when you are not at a hospital or clinic. Record the average of the two measurements. To check your blood pressure when you are not at a hospital or clinic, you can use:  An automated blood pressure machine at a pharmacy.  A home blood pressure monitor.  If you are between 45 years and 39 years old, ask your health care Coumba Kellison if you should take aspirin to prevent strokes.  Have regular diabetes screenings. This involves taking a blood sample to check your fasting blood sugar level.  If you are at a normal weight and have a low risk for diabetes,  have this test once every three years after 68 years of age.  If you are overweight and have a high risk for diabetes, consider being tested at a younger age or more often. PREVENTING INFECTION  Hepatitis B  If you have a higher risk for hepatitis B, you should be screened for this virus. You are considered at high risk for hepatitis B if:  You were born in a country where hepatitis B is common. Ask your health care Velvet Moomaw which countries are considered high risk.  Your parents were born in a high-risk country, and you have not been immunized against hepatitis B (hepatitis B vaccine).  You have HIV or AIDS.  You use needles to inject street drugs.  You live with someone who has hepatitis B.  You have had sex with someone who has hepatitis B.  You get hemodialysis treatment.  You take certain medicines for conditions, including cancer, organ transplantation, and autoimmune conditions. Hepatitis C  Blood testing is recommended for:  Everyone born from [birth year] through 1967.  Anyone with known risk factors for hepatitis C. Sexually transmitted infections (STIs)  You should be screened for sexually transmitted infections (STIs) including gonorrhea and chlamydia if:  You are sexually active and are younger than 67 years of age.  You are older than 67 years of age and your health care Mary Reed tells you that you are at risk for this type of infection.  Your sexual activity has changed since you were last screened and you are at an increased risk for chlamydia or gonorrhea. Ask your health care Mary Reed if you are at risk.  If you do not have HIV, but are at risk, it may be recommended that you take a prescription medicine daily to prevent HIV infection. This is called pre-exposure prophylaxis (PrEP). You are considered at risk if:  You are sexually active and do not regularly use condoms or know the HIV status of your partner(s).  You take drugs by injection.  You are sexually  active with a partner who has HIV. Talk with your health care Mary Reed about whether you are at high risk of being infected with HIV. If you choose to begin PrEP, you should first be tested for HIV. You should then be tested every 3 months for as long as you are taking PrEP.  PREGNANCY   If you are premenopausal and you may become pregnant, ask your health care Mary Reed about preconception counseling.  If you may  become pregnant, take 400 to 800 micrograms (mcg) of folic acid every day.  If you want to prevent pregnancy, talk to your health care Mary Reed Boldin about birth control (contraception). OSTEOPOROSIS AND MENOPAUSE   Osteoporosis is a disease in which the bones lose minerals and strength with aging. This can result in serious bone fractures. Your risk for osteoporosis can be identified using a bone density scan.  If you are 61 years of age or older, or if you are at risk for osteoporosis and fractures, ask your health care Jammy Plotkin if you should be screened.  Ask your health care Jenavee Laguardia whether you should take a calcium or vitamin D supplement to lower your risk for osteoporosis.  Menopause may have certain physical symptoms and risks.  Hormone replacement therapy may reduce some of these symptoms and risks. Talk to your health care Renzo Vincelette about whether hormone replacement therapy is right for you.  HOME CARE INSTRUCTIONS   Schedule regular health, dental, and eye exams.  Stay current with your immunizations.   Do not use any tobacco products including cigarettes, chewing tobacco, or electronic cigarettes.  If you are pregnant, do not drink alcohol.  If you are breastfeeding, limit how much and how often you drink alcohol.  Limit alcohol intake to no more than 1 drink per day for nonpregnant women. One drink equals 12 ounces of beer, 5 ounces of wine, or 1 ounces of hard liquor.  Do not use street drugs.  Do not share needles.  Ask your health care Beauregard Jarrells for help if  you need support or information about quitting drugs.  Tell your health care Shamon Cothran if you often feel depressed.  Tell your health care Blakely Maranan if you have ever been abused or do not feel safe at home.   This information is not intended to replace advice given to you by your health care Shauntay Brunelli. Make sure you discuss any questions you have with your health care Riker Collier.   Document Released: 05/22/2011 Document Revised: 11/27/2014 Document Reviewed: 10/08/2013 Elsevier Interactive Patient Education [employer].

## 2016-04-27 LAB — IPS PAP SMEAR ONLY

## 2016-05-17 ENCOUNTER — Ambulatory Visit: Payer: Medicare HMO | Admitting: Obstetrics and Gynecology

## 2016-06-06 ENCOUNTER — Encounter: Payer: Self-pay | Admitting: Obstetrics and Gynecology

## 2016-12-13 DIAGNOSIS — E118 Type 2 diabetes mellitus with unspecified complications: Secondary | ICD-10-CM | POA: Diagnosis not present

## 2016-12-13 DIAGNOSIS — E7800 Pure hypercholesterolemia, unspecified: Secondary | ICD-10-CM | POA: Diagnosis not present

## 2016-12-18 DIAGNOSIS — E559 Vitamin D deficiency, unspecified: Secondary | ICD-10-CM | POA: Diagnosis not present

## 2016-12-18 DIAGNOSIS — Z0000 Encounter for general adult medical examination without abnormal findings: Secondary | ICD-10-CM | POA: Diagnosis not present

## 2016-12-18 DIAGNOSIS — R69 Illness, unspecified: Secondary | ICD-10-CM | POA: Diagnosis not present

## 2016-12-18 DIAGNOSIS — I10 Essential (primary) hypertension: Secondary | ICD-10-CM | POA: Diagnosis not present

## 2017-02-12 DIAGNOSIS — E559 Vitamin D deficiency, unspecified: Secondary | ICD-10-CM | POA: Diagnosis not present

## 2017-02-12 DIAGNOSIS — E118 Type 2 diabetes mellitus with unspecified complications: Secondary | ICD-10-CM | POA: Diagnosis not present

## 2017-02-12 DIAGNOSIS — Z79899 Other long term (current) drug therapy: Secondary | ICD-10-CM | POA: Diagnosis not present

## 2017-02-13 DIAGNOSIS — E559 Vitamin D deficiency, unspecified: Secondary | ICD-10-CM | POA: Diagnosis not present

## 2017-02-13 DIAGNOSIS — I10 Essential (primary) hypertension: Secondary | ICD-10-CM | POA: Diagnosis not present

## 2017-02-13 DIAGNOSIS — E7800 Pure hypercholesterolemia, unspecified: Secondary | ICD-10-CM | POA: Diagnosis not present

## 2017-02-13 DIAGNOSIS — E119 Type 2 diabetes mellitus without complications: Secondary | ICD-10-CM | POA: Diagnosis not present

## 2017-04-23 DIAGNOSIS — E119 Type 2 diabetes mellitus without complications: Secondary | ICD-10-CM | POA: Diagnosis not present

## 2017-04-23 DIAGNOSIS — H524 Presbyopia: Secondary | ICD-10-CM | POA: Diagnosis not present

## 2017-05-01 DIAGNOSIS — K219 Gastro-esophageal reflux disease without esophagitis: Secondary | ICD-10-CM | POA: Diagnosis not present

## 2017-05-01 DIAGNOSIS — R0989 Other specified symptoms and signs involving the circulatory and respiratory systems: Secondary | ICD-10-CM | POA: Diagnosis not present
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
## 2017-05-09 ENCOUNTER — Ambulatory Visit (INDEPENDENT_AMBULATORY_CARE_PROVIDER_SITE_OTHER): Payer: Medicare HMO | Admitting: Obstetrics and Gynecology

## 2017-05-09 ENCOUNTER — Other Ambulatory Visit (HOSPITAL_COMMUNITY)
Admission: RE | Admit: 2017-05-09 | Discharge: 2017-05-09 | Disposition: A | Discharge status: Home / Self Care | Payer: Medicare HMO | Source: Ambulatory Visit | Attending: Obstetrics and Gynecology | Admitting: Obstetrics and Gynecology

## 2017-05-09 ENCOUNTER — Encounter: Payer: Self-pay | Admitting: Obstetrics and Gynecology

## 2017-05-09 VITALS — BP 100/60 | HR 84 | Resp 16 | Ht 68.0 in | Wt 183.0 lb

## 2017-05-09 DIAGNOSIS — Z01419 Encounter for gynecological examination (general) (routine) without abnormal findings: Secondary | ICD-10-CM

## 2017-05-09 DIAGNOSIS — Z124 Encounter for screening for malignant neoplasm of cervix: Secondary | ICD-10-CM | POA: Diagnosis not present

## 2017-05-09 NOTE — Patient Instructions (Signed)

## 2017-05-10 LAB — CYTOLOGY - PAP: Diagnosis: NEGATIVE

## 2017-05-14 DIAGNOSIS — E559 Vitamin D deficiency, unspecified: Secondary | ICD-10-CM | POA: Diagnosis not present

## 2017-05-14 DIAGNOSIS — E119 Type 2 diabetes mellitus without complications: Secondary | ICD-10-CM | POA: Diagnosis not present

## 2017-05-14 DIAGNOSIS — I10 Essential (primary) hypertension: Secondary | ICD-10-CM | POA: Diagnosis not present

## 2017-05-15 DIAGNOSIS — E559 Vitamin D deficiency, unspecified: Secondary | ICD-10-CM | POA: Diagnosis not present

## 2017-05-15 DIAGNOSIS — I10 Essential (primary) hypertension: Secondary | ICD-10-CM | POA: Diagnosis not present

## 2017-05-15 DIAGNOSIS — E119 Type 2 diabetes mellitus without complications: Secondary | ICD-10-CM | POA: Diagnosis not present

## 2017-05-28 DIAGNOSIS — K219 Gastro-esophageal reflux disease without esophagitis: Secondary | ICD-10-CM | POA: Diagnosis not present

## 2017-05-28 DIAGNOSIS — I10 Essential (primary) hypertension: Secondary | ICD-10-CM | POA: Diagnosis not present

## 2017-06-05 DIAGNOSIS — Z1231 Encounter for screening mammogram for malignant neoplasm of breast: Secondary | ICD-10-CM | POA: Diagnosis not present

## 2017-07-25 DIAGNOSIS — J45909 Unspecified asthma, uncomplicated: Secondary | ICD-10-CM | POA: Diagnosis not present

## 2017-07-25 DIAGNOSIS — R062 Wheezing: Secondary | ICD-10-CM | POA: Diagnosis not present

## 2017-08-08 ENCOUNTER — Encounter: Payer: Self-pay | Admitting: Obstetrics and Gynecology

## 2017-08-13 DIAGNOSIS — E559 Vitamin D deficiency, unspecified: Secondary | ICD-10-CM | POA: Diagnosis not present

## 2017-08-13 DIAGNOSIS — E119 Type 2 diabetes mellitus without complications: Secondary | ICD-10-CM | POA: Diagnosis not present

## 2017-08-14 DIAGNOSIS — I10 Essential (primary) hypertension: Secondary | ICD-10-CM | POA: Diagnosis not present

## 2017-08-14 DIAGNOSIS — E559 Vitamin D deficiency, unspecified: Secondary | ICD-10-CM | POA: Diagnosis not present

## 2017-08-14 DIAGNOSIS — E118 Type 2 diabetes mellitus with unspecified complications: Secondary | ICD-10-CM | POA: Diagnosis not present

## 2017-08-22 DIAGNOSIS — R69 Illness, unspecified: Secondary | ICD-10-CM | POA: Diagnosis not present

## 2017-09-19 ENCOUNTER — Telehealth: Payer: Self-pay | Admitting: Obstetrics and Gynecology

## 2017-09-19 NOTE — Telephone Encounter (Signed)
Patient is having some abnormal bleeding. °

## 2017-09-19 NOTE — Telephone Encounter (Signed)
Spoke with patient. Patient states she has been experiencing bleeding when wiping for the last 10 days, unsure if r/t vagina or hemorrhoid. Postmenopausal.   Denies pain. States bowel movements go from constipation to diarrhea, strains at times.    Recommended OV for further evaluation, patient request to schedule OV on 11/12, recommended earlier appointment for evaluation. Patient scheduled for 09/24/17 at 2:30pm with Dr. Quincy Simmonds. Patient verbalizes understanding and is agreeable.   Routing to provider for final review. Patient is agreeable to disposition. Will close encounter.

## 2017-09-24 ENCOUNTER — Encounter: Payer: Self-pay | Admitting: Obstetrics and Gynecology

## 2017-09-24 ENCOUNTER — Ambulatory Visit: Payer: Medicare HMO | Admitting: Obstetrics and Gynecology

## 2017-09-24 VITALS — BP 124/70 | HR 74 | Resp 14 | Wt 191.0 lb

## 2017-09-24 DIAGNOSIS — N950 Postmenopausal bleeding: Secondary | ICD-10-CM

## 2017-09-24 DIAGNOSIS — K649 Unspecified hemorrhoids: Secondary | ICD-10-CM | POA: Diagnosis not present

## 2017-09-24 DIAGNOSIS — Z1211 Encounter for screening for malignant neoplasm of colon: Secondary | ICD-10-CM | POA: Diagnosis not present
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
## 2017-09-25 ENCOUNTER — Telehealth: Payer: Self-pay | Admitting: Obstetrics and Gynecology

## 2017-09-25 NOTE — Telephone Encounter (Signed)
Phone encounter reviewed and closed by me.

## 2017-09-25 NOTE — Telephone Encounter (Signed)
Spoke with patient regarding benefit for transabdominal only ultrasound. Patient understood and agreeable. Patient ready to schedule. Patient scheduled Monday, 10/01/17  with Dr Quincy Simmonds. Patient advised to drink 32 oz of water one hour prior to appointment, advising a full bladder is needed for this type of ultrasound. Patient understood and agreeable. Patient aware of appointment date, arrival time and cancellation policy. No further questions. Ok to close  Routing to Dr Quincy Simmonds for review

## 2017-10-01 ENCOUNTER — Encounter: Payer: Self-pay | Admitting: Obstetrics and Gynecology

## 2017-10-01 ENCOUNTER — Ambulatory Visit: Payer: Medicare HMO | Admitting: Obstetrics and Gynecology

## 2017-10-01 ENCOUNTER — Ambulatory Visit (INDEPENDENT_AMBULATORY_CARE_PROVIDER_SITE_OTHER): Payer: Medicare HMO

## 2017-10-01 ENCOUNTER — Telehealth: Payer: Self-pay

## 2017-10-01 VITALS — BP 154/82 | HR 70 | Ht 68.0 in | Wt 191.0 lb

## 2017-10-01 DIAGNOSIS — N859 Noninflammatory disorder of uterus, unspecified: Secondary | ICD-10-CM

## 2017-10-01 DIAGNOSIS — N950 Postmenopausal bleeding: Secondary | ICD-10-CM | POA: Diagnosis not present

## 2017-10-01 LAB — FECAL OCCULT BLOOD, IMMUNOCHEMICAL: IFOBT: NEGATIVE

## 2017-10-01 NOTE — Telephone Encounter (Signed)
Patient seen today for PUS and she brought in IFOB also. Read IFOB while patient was here and it was negative. Patient was informed. Routed to [place]. Patient rescheduled appointment to discuss PUS results with Dr.Silva on 10-08-17 3:30pm.

## 2017-10-01 NOTE — Progress Notes (Signed)
Encounter reviewed by Dr. Stormy Connon Amundson C. Silva.  

## 2017-10-01 NOTE — Telephone Encounter (Signed)
I reviewed this encounter, the IFOB, and closed it.

## 2017-10-03 ENCOUNTER — Telehealth: Payer: Self-pay | Admitting: *Deleted

## 2017-10-03 NOTE — Telephone Encounter (Signed)
Routing to Dr. Antony Blackbird and Deloris Ping D. To confirm precert of endometrial biopsy.   Notes recorded by Burnice Logan, RN on 10/03/2017 at 12:03 PM EST Spoke with patient, advised as seen below per Dr. Quincy Simmonds. Patient request to discuss results and recommendations further at [facility] on 10/08/17 before proceeding with EMB. Advised patient would update Dr. Quincy Simmonds. Patient verbalizes understanding. See telephone encounter dated 10/03/17 to review with provider. ------  Notes recorded by Nunzio Cobbs, MD on 10/03/2017 at 5:45 AM EST Please contact patient regarding her ultrasound results and planning for her next visit.  I reviewed the ultrasound she had done at the office in my absence.  Because the measurement of the lining of her uterus is just over 3 mm and because there is some fluid inside the cavity, and endometrial biopsy is recommended. She had some calcifications in the wall of the uterus, which is not of concern.  Her ovaries were normal.  I just want her to understand our plan for her follow up visit will be to sample the lining of her uterus.  This should have already been precerted.  She can take Motrin 800 mg prior to the office visit.

## 2017-10-03 NOTE — Telephone Encounter (Signed)
Encounter reviewed and closed.  

## 2017-10-08 ENCOUNTER — Ambulatory Visit (INDEPENDENT_AMBULATORY_CARE_PROVIDER_SITE_OTHER): Payer: Medicare HMO | Admitting: Obstetrics and Gynecology

## 2017-10-08 VITALS — BP 124/76 | HR 70 | Ht 68.0 in | Wt 191.0 lb

## 2017-10-08 DIAGNOSIS — N950 Postmenopausal bleeding: Secondary | ICD-10-CM | POA: Diagnosis not present
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
## 2017-10-08 NOTE — Patient Instructions (Signed)

## 2017-10-09 ENCOUNTER — Encounter: Payer: Self-pay | Admitting: Obstetrics and Gynecology

## 2017-11-05 DIAGNOSIS — E559 Vitamin D deficiency, unspecified: Secondary | ICD-10-CM | POA: Diagnosis not present

## 2017-11-05 DIAGNOSIS — E118 Type 2 diabetes mellitus with unspecified complications: Secondary | ICD-10-CM | POA: Diagnosis not present

## 2017-11-06 DIAGNOSIS — E119 Type 2 diabetes mellitus without complications: Secondary | ICD-10-CM | POA: Diagnosis not present

## 2017-11-06 DIAGNOSIS — E559 Vitamin D deficiency, unspecified: Secondary | ICD-10-CM | POA: Diagnosis not present

## 2018-01-01 DIAGNOSIS — E7800 Pure hypercholesterolemia, unspecified: Secondary | ICD-10-CM | POA: Diagnosis not present

## 2018-01-01 DIAGNOSIS — E118 Type 2 diabetes mellitus with unspecified complications: Secondary | ICD-10-CM | POA: Diagnosis not present

## 2018-01-01 DIAGNOSIS — E663 Overweight: Secondary | ICD-10-CM | POA: Diagnosis not present

## 2018-01-01 DIAGNOSIS — Z0000 Encounter for general adult medical examination without abnormal findings: Secondary | ICD-10-CM | POA: Diagnosis not present

## 2018-01-01 DIAGNOSIS — E559 Vitamin D deficiency, unspecified: Secondary | ICD-10-CM | POA: Diagnosis not present

## 2018-01-01 DIAGNOSIS — I10 Essential (primary) hypertension: Secondary | ICD-10-CM | POA: Diagnosis not present

## 2018-01-01 DIAGNOSIS — R011 Cardiac murmur, unspecified: Secondary | ICD-10-CM | POA: Diagnosis not present

## 2018-01-14 DIAGNOSIS — R0989 Other specified symptoms and signs involving the circulatory and respiratory systems: Secondary | ICD-10-CM | POA: Diagnosis not present

## 2018-01-14 DIAGNOSIS — E119 Type 2 diabetes mellitus without complications: Secondary | ICD-10-CM | POA: Diagnosis not present

## 2018-01-14 DIAGNOSIS — R011 Cardiac murmur, unspecified: Secondary | ICD-10-CM | POA: Diagnosis not present

## 2018-01-14 DIAGNOSIS — I10 Essential (primary) hypertension: Secondary | ICD-10-CM | POA: Diagnosis not present

## 2018-01-18 DIAGNOSIS — R011 Cardiac murmur, unspecified: Secondary | ICD-10-CM | POA: Diagnosis not present

## 2018-01-18 DIAGNOSIS — R0989 Other specified symptoms and signs involving the circulatory and respiratory systems: Secondary | ICD-10-CM | POA: Diagnosis not present

## 2018-01-28 DIAGNOSIS — R011 Cardiac murmur, unspecified: Secondary | ICD-10-CM | POA: Diagnosis not present

## 2018-01-28 DIAGNOSIS — I10 Essential (primary) hypertension: Secondary | ICD-10-CM | POA: Diagnosis not present

## 2018-01-28 DIAGNOSIS — R0602 Shortness of breath: Secondary | ICD-10-CM | POA: Diagnosis not present

## 2018-01-28 DIAGNOSIS — I6523 Occlusion and stenosis of bilateral carotid arteries: Secondary | ICD-10-CM | POA: Diagnosis not present

## 2018-02-04 DIAGNOSIS — E559 Vitamin D deficiency, unspecified: Secondary | ICD-10-CM | POA: Diagnosis not present

## 2018-02-04 DIAGNOSIS — E119 Type 2 diabetes mellitus without complications: Secondary | ICD-10-CM | POA: Diagnosis not present

## 2018-02-04 DIAGNOSIS — I10 Essential (primary) hypertension: Secondary | ICD-10-CM | POA: Diagnosis not present

## 2018-02-06 DIAGNOSIS — I10 Essential (primary) hypertension: Secondary | ICD-10-CM | POA: Diagnosis not present

## 2018-02-06 DIAGNOSIS — E119 Type 2 diabetes mellitus without complications: Secondary | ICD-10-CM | POA: Diagnosis not present

## 2018-02-11 DIAGNOSIS — I6523 Occlusion and stenosis of bilateral carotid arteries: Secondary | ICD-10-CM | POA: Diagnosis not present

## 2018-02-11 DIAGNOSIS — R0602 Shortness of breath: Secondary | ICD-10-CM | POA: Diagnosis not present

## 2018-02-12 DIAGNOSIS — E119 Type 2 diabetes mellitus without complications: Secondary | ICD-10-CM | POA: Diagnosis not present

## 2018-02-12 DIAGNOSIS — I10 Essential (primary) hypertension: Secondary | ICD-10-CM | POA: Diagnosis not present

## 2018-02-20 DIAGNOSIS — E559 Vitamin D deficiency, unspecified: Secondary | ICD-10-CM | POA: Diagnosis not present

## 2018-02-20 DIAGNOSIS — I10 Essential (primary) hypertension: Secondary | ICD-10-CM | POA: Diagnosis not present

## 2018-02-20 DIAGNOSIS — E119 Type 2 diabetes mellitus without complications: Secondary | ICD-10-CM | POA: Diagnosis not present

## 2018-03-05 DIAGNOSIS — R51 Headache: Secondary | ICD-10-CM | POA: Diagnosis not present

## 2018-03-05 DIAGNOSIS — J069 Acute upper respiratory infection, unspecified: Secondary | ICD-10-CM | POA: Diagnosis not present

## 2018-03-05 DIAGNOSIS — I10 Essential (primary) hypertension: Secondary | ICD-10-CM | POA: Diagnosis not present

## 2018-03-05 DIAGNOSIS — E7800 Pure hypercholesterolemia, unspecified: Secondary | ICD-10-CM | POA: Diagnosis not present

## 2018-03-18 DIAGNOSIS — R69 Illness, unspecified: Secondary | ICD-10-CM | POA: Diagnosis not present

## 2018-03-19 DIAGNOSIS — R69 Illness, unspecified: Secondary | ICD-10-CM | POA: Diagnosis not present

## 2018-04-29 DIAGNOSIS — E559 Vitamin D deficiency, unspecified: Secondary | ICD-10-CM | POA: Diagnosis not present

## 2018-04-29 DIAGNOSIS — E119 Type 2 diabetes mellitus without complications: Secondary | ICD-10-CM | POA: Diagnosis not present

## 2018-05-01 DIAGNOSIS — E559 Vitamin D deficiency, unspecified: Secondary | ICD-10-CM | POA: Diagnosis not present

## 2018-05-01 DIAGNOSIS — I10 Essential (primary) hypertension: Secondary | ICD-10-CM | POA: Diagnosis not present

## 2018-05-01 DIAGNOSIS — E118 Type 2 diabetes mellitus with unspecified complications: Secondary | ICD-10-CM | POA: Diagnosis not present

## 2018-05-15 ENCOUNTER — Ambulatory Visit: Payer: Medicare HMO | Admitting: Obstetrics and Gynecology

## 2018-06-05 DIAGNOSIS — M25511 Pain in right shoulder: Secondary | ICD-10-CM | POA: Diagnosis not present

## 2018-06-11 DIAGNOSIS — Z1231 Encounter for screening mammogram for malignant neoplasm of breast: Secondary | ICD-10-CM | POA: Diagnosis not present

## 2018-06-18 ENCOUNTER — Ambulatory Visit (INDEPENDENT_AMBULATORY_CARE_PROVIDER_SITE_OTHER): Payer: Medicare HMO | Admitting: Obstetrics and Gynecology

## 2018-06-18 ENCOUNTER — Encounter: Payer: Self-pay | Admitting: Obstetrics and Gynecology

## 2018-06-18 ENCOUNTER — Other Ambulatory Visit (HOSPITAL_COMMUNITY)
Admission: RE | Admit: 2018-06-18 | Discharge: 2018-06-18 | Disposition: A | Discharge status: Home / Self Care | Payer: Medicare HMO | Source: Ambulatory Visit | Attending: Obstetrics and Gynecology | Admitting: Obstetrics and Gynecology

## 2018-06-18 VITALS — BP 132/64 | HR 78 | Ht 68.0 in | Wt 183.0 lb

## 2018-06-18 DIAGNOSIS — Z01419 Encounter for gynecological examination (general) (routine) without abnormal findings: Secondary | ICD-10-CM | POA: Insufficient documentation

## 2018-06-18 NOTE — Patient Instructions (Signed)
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
## 2018-06-19 LAB — CYTOLOGY - PAP: Diagnosis: NEGATIVE

## 2018-06-25 ENCOUNTER — Encounter: Payer: Self-pay | Admitting: Obstetrics and Gynecology

## 2018-07-01 DIAGNOSIS — E559 Vitamin D deficiency, unspecified: Secondary | ICD-10-CM | POA: Diagnosis not present

## 2018-07-01 DIAGNOSIS — E118 Type 2 diabetes mellitus with unspecified complications: Secondary | ICD-10-CM | POA: Diagnosis not present

## 2018-07-01 DIAGNOSIS — E7800 Pure hypercholesterolemia, unspecified: Secondary | ICD-10-CM | POA: Diagnosis not present

## 2018-07-03 DIAGNOSIS — I10 Essential (primary) hypertension: Secondary | ICD-10-CM | POA: Diagnosis not present

## 2018-07-03 DIAGNOSIS — E118 Type 2 diabetes mellitus with unspecified complications: Secondary | ICD-10-CM | POA: Diagnosis not present

## 2018-07-03 DIAGNOSIS — E7800 Pure hypercholesterolemia, unspecified: Secondary | ICD-10-CM | POA: Diagnosis not present

## 2018-07-03 DIAGNOSIS — K219 Gastro-esophageal reflux disease without esophagitis: Secondary | ICD-10-CM | POA: Diagnosis not present

## 2018-07-03 DIAGNOSIS — E559 Vitamin D deficiency, unspecified: Secondary | ICD-10-CM | POA: Diagnosis not present

## 2018-07-04 DIAGNOSIS — H25813 Combined forms of age-related cataract, bilateral: Secondary | ICD-10-CM | POA: Diagnosis not present

## 2018-07-04 DIAGNOSIS — E119 Type 2 diabetes mellitus without complications: Secondary | ICD-10-CM | POA: Diagnosis not present

## 2018-07-04 DIAGNOSIS — H5203 Hypermetropia, bilateral: Secondary | ICD-10-CM | POA: Diagnosis not present

## 2018-07-04 DIAGNOSIS — G43909 Migraine, unspecified, not intractable, without status migrainosus: Secondary | ICD-10-CM | POA: Diagnosis not present

## 2018-08-04 DIAGNOSIS — R69 Illness, unspecified: Secondary | ICD-10-CM | POA: Diagnosis not present

## 2018-08-23 DIAGNOSIS — I6523 Occlusion and stenosis of bilateral carotid arteries: Secondary | ICD-10-CM | POA: Diagnosis not present

## 2018-08-30 DIAGNOSIS — R0602 Shortness of breath: Secondary | ICD-10-CM | POA: Diagnosis not present

## 2018-08-30 DIAGNOSIS — I350 Nonrheumatic aortic (valve) stenosis: Secondary | ICD-10-CM | POA: Diagnosis not present

## 2018-08-30 DIAGNOSIS — I10 Essential (primary) hypertension: Secondary | ICD-10-CM | POA: Diagnosis not present

## 2018-08-30 DIAGNOSIS — I6523 Occlusion and stenosis of bilateral carotid arteries: Secondary | ICD-10-CM | POA: Diagnosis not present

## 2018-09-30 DIAGNOSIS — E118 Type 2 diabetes mellitus with unspecified complications: Secondary | ICD-10-CM | POA: Diagnosis not present

## 2018-10-02 DIAGNOSIS — I10 Essential (primary) hypertension: Secondary | ICD-10-CM | POA: Diagnosis not present

## 2018-10-02 DIAGNOSIS — E7800 Pure hypercholesterolemia, unspecified: Secondary | ICD-10-CM | POA: Diagnosis not present

## 2018-10-02 DIAGNOSIS — E118 Type 2 diabetes mellitus with unspecified complications: Secondary | ICD-10-CM | POA: Diagnosis not present

## 2018-10-04 DIAGNOSIS — I350 Nonrheumatic aortic (valve) stenosis: Secondary | ICD-10-CM | POA: Diagnosis not present

## 2018-10-04 DIAGNOSIS — R0602 Shortness of breath: Secondary | ICD-10-CM | POA: Diagnosis not present

## 2018-12-01 DIAGNOSIS — R69 Illness, unspecified: Secondary | ICD-10-CM | POA: Diagnosis not present

## 2018-12-17 DIAGNOSIS — M5136 Other intervertebral disc degeneration, lumbar region: Secondary | ICD-10-CM | POA: Diagnosis not present

## 2019-01-01 DIAGNOSIS — M5136 Other intervertebral disc degeneration, lumbar region: Secondary | ICD-10-CM | POA: Diagnosis not present

## 2019-01-06 DIAGNOSIS — Z1159 Encounter for screening for other viral diseases: Secondary | ICD-10-CM | POA: Diagnosis not present

## 2019-01-06 DIAGNOSIS — I10 Essential (primary) hypertension: Secondary | ICD-10-CM | POA: Diagnosis not present

## 2019-01-06 DIAGNOSIS — H43813 Vitreous degeneration, bilateral: Secondary | ICD-10-CM | POA: Diagnosis not present

## 2019-01-06 DIAGNOSIS — H25813 Combined forms of age-related cataract, bilateral: Secondary | ICD-10-CM | POA: Diagnosis not present

## 2019-01-06 DIAGNOSIS — E7800 Pure hypercholesterolemia, unspecified: Secondary | ICD-10-CM | POA: Diagnosis not present

## 2019-01-06 DIAGNOSIS — D649 Anemia, unspecified: Secondary | ICD-10-CM | POA: Diagnosis not present

## 2019-01-06 DIAGNOSIS — E119 Type 2 diabetes mellitus without complications: Secondary | ICD-10-CM | POA: Diagnosis not present

## 2019-01-06 DIAGNOSIS — H5203 Hypermetropia, bilateral: Secondary | ICD-10-CM | POA: Diagnosis not present

## 2019-01-06 DIAGNOSIS — E118 Type 2 diabetes mellitus with unspecified complications: Secondary | ICD-10-CM | POA: Diagnosis not present

## 2019-01-08 DIAGNOSIS — Z0000 Encounter for general adult medical examination without abnormal findings: Secondary | ICD-10-CM | POA: Diagnosis not present

## 2019-01-08 DIAGNOSIS — I6529 Occlusion and stenosis of unspecified carotid artery: Secondary | ICD-10-CM | POA: Diagnosis not present

## 2019-01-08 DIAGNOSIS — K219 Gastro-esophageal reflux disease without esophagitis: Secondary | ICD-10-CM | POA: Diagnosis not present

## 2019-01-08 DIAGNOSIS — M5430 Sciatica, unspecified side: Secondary | ICD-10-CM | POA: Diagnosis not present

## 2019-01-08 DIAGNOSIS — Z823 Family history of stroke: Secondary | ICD-10-CM | POA: Diagnosis not present

## 2019-01-08 DIAGNOSIS — I10 Essential (primary) hypertension: Secondary | ICD-10-CM | POA: Diagnosis not present

## 2019-01-08 DIAGNOSIS — D649 Anemia, unspecified: Secondary | ICD-10-CM | POA: Diagnosis not present

## 2019-01-08 DIAGNOSIS — Z23 Encounter for immunization: Secondary | ICD-10-CM | POA: Diagnosis not present

## 2019-01-08 DIAGNOSIS — I350 Nonrheumatic aortic (valve) stenosis: Secondary | ICD-10-CM | POA: Diagnosis not present

## 2019-01-08 DIAGNOSIS — E118 Type 2 diabetes mellitus with unspecified complications: Secondary | ICD-10-CM | POA: Diagnosis not present

## 2019-01-14 ENCOUNTER — Other Ambulatory Visit: Payer: Self-pay | Admitting: Cardiology

## 2019-01-14 DIAGNOSIS — I6523 Occlusion and stenosis of bilateral carotid arteries: Secondary | ICD-10-CM

## 2019-01-22 DIAGNOSIS — J069 Acute upper respiratory infection, unspecified: Secondary | ICD-10-CM | POA: Diagnosis not present

## 2019-01-22 DIAGNOSIS — D509 Iron deficiency anemia, unspecified: Secondary | ICD-10-CM | POA: Diagnosis not present

## 2019-01-22 DIAGNOSIS — K625 Hemorrhage of anus and rectum: Secondary | ICD-10-CM | POA: Diagnosis not present

## 2019-01-23 ENCOUNTER — Encounter: Payer: Self-pay | Admitting: Gastroenterology

## 2019-02-03 ENCOUNTER — Ambulatory Visit: Payer: Medicare HMO | Admitting: Cardiology

## 2019-02-12 DIAGNOSIS — D649 Anemia, unspecified: Secondary | ICD-10-CM | POA: Diagnosis not present

## 2019-02-12 DIAGNOSIS — E118 Type 2 diabetes mellitus with unspecified complications: Secondary | ICD-10-CM | POA: Diagnosis not present

## 2019-02-12 DIAGNOSIS — I10 Essential (primary) hypertension: Secondary | ICD-10-CM | POA: Diagnosis not present

## 2019-02-12 DIAGNOSIS — E7800 Pure hypercholesterolemia, unspecified: Secondary | ICD-10-CM | POA: Diagnosis not present

## 2019-02-27 ENCOUNTER — Other Ambulatory Visit: Payer: Self-pay

## 2019-03-03 ENCOUNTER — Ambulatory Visit: Payer: Medicare HMO | Admitting: Cardiology

## 2019-03-04 ENCOUNTER — Ambulatory Visit: Payer: Commercial Managed Care - HMO | Admitting: Gastroenterology

## 2019-05-09 ENCOUNTER — Other Ambulatory Visit: Payer: Self-pay

## 2019-05-09 ENCOUNTER — Ambulatory Visit: Payer: Medicare HMO

## 2019-05-16 DIAGNOSIS — R69 Illness, unspecified: Secondary | ICD-10-CM | POA: Diagnosis not present

## 2019-05-19 ENCOUNTER — Ambulatory Visit: Payer: Medicare HMO | Admitting: Cardiology

## 2019-05-19 DIAGNOSIS — E7800 Pure hypercholesterolemia, unspecified: Secondary | ICD-10-CM | POA: Diagnosis not present

## 2019-05-19 DIAGNOSIS — E559 Vitamin D deficiency, unspecified: Secondary | ICD-10-CM | POA: Diagnosis not present

## 2019-05-19 DIAGNOSIS — D649 Anemia, unspecified: Secondary | ICD-10-CM | POA: Diagnosis not present

## 2019-05-19 DIAGNOSIS — E118 Type 2 diabetes mellitus with unspecified complications: Secondary | ICD-10-CM | POA: Diagnosis not present

## 2019-05-19 DIAGNOSIS — I10 Essential (primary) hypertension: Secondary | ICD-10-CM | POA: Diagnosis not present

## 2019-05-21 DIAGNOSIS — E559 Vitamin D deficiency, unspecified: Secondary | ICD-10-CM | POA: Diagnosis not present

## 2019-05-21 DIAGNOSIS — E7800 Pure hypercholesterolemia, unspecified: Secondary | ICD-10-CM | POA: Diagnosis not present

## 2019-05-21 DIAGNOSIS — I10 Essential (primary) hypertension: Secondary | ICD-10-CM | POA: Diagnosis not present

## 2019-05-21 DIAGNOSIS — E118 Type 2 diabetes mellitus with unspecified complications: Secondary | ICD-10-CM | POA: Diagnosis not present

## 2019-06-03 DIAGNOSIS — H524 Presbyopia: Secondary | ICD-10-CM | POA: Diagnosis not present

## 2019-06-03 DIAGNOSIS — H52223 Regular astigmatism, bilateral: Secondary | ICD-10-CM | POA: Diagnosis not present

## 2019-06-05 ENCOUNTER — Telehealth: Payer: Self-pay | Admitting: *Deleted

## 2019-06-05 NOTE — Telephone Encounter (Signed)
Covid-19 screening questions   Do you now or have you had a fever in the last 14 days no   Do you have any respiratory symptoms of shortness of breath or cough now or in the last 14 days no  Do you have any family members or close contacts with diagnosed or suspected Covid-19 in the past 14 days no  Have you been tested for Covid-19 and found to be positive no          

## 2019-06-06 ENCOUNTER — Encounter: Payer: Self-pay | Admitting: Gastroenterology

## 2019-06-06 ENCOUNTER — Ambulatory Visit (INDEPENDENT_AMBULATORY_CARE_PROVIDER_SITE_OTHER): Payer: Medicare HMO | Admitting: Gastroenterology

## 2019-06-06 ENCOUNTER — Other Ambulatory Visit: Payer: Self-pay

## 2019-06-06 VITALS — BP 132/70 | HR 88 | Temp 97.6°F | Ht 68.0 in | Wt 183.0 lb

## 2019-06-06 DIAGNOSIS — K642 Third degree hemorrhoids: Secondary | ICD-10-CM | POA: Diagnosis not present

## 2019-06-06 DIAGNOSIS — K625 Hemorrhage of anus and rectum: Secondary | ICD-10-CM

## 2019-06-06 MED ORDER — NA SULFATE-K SULFATE-MG SULF 17.5-3.13-1.6 GM/177ML PO SOLN: ORAL | 0 refills | Status: DC
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
## 2019-06-06 NOTE — Patient Instructions (Signed)
You have been scheduled for a colonoscopy. Please follow written instructions given to you at your visit today.  Please pick up your prep supplies at the pharmacy within the next 1-3 days. If you use inhalers (even only as needed), please bring them with you on the day of your procedure.   Use Metamucil 1 teaspoon twice daily  Use OTC Preparation H suppositories at bedtime daily as needed  I appreciate the  opportunity to care for you  Thank You   Harl Bowie , MD

## 2019-06-09 ENCOUNTER — Telehealth: Payer: Self-pay | Admitting: Gastroenterology

## 2019-06-09 NOTE — Telephone Encounter (Signed)
I have spoken to [place] and I put a sample kit up front for her to pick up before her 07/14/2019 procedure. She was very Patent attorney.

## 2019-06-09 NOTE — Telephone Encounter (Signed)
Patient called said that the suprep was $104.00 and would like something cheaper.

## 2019-06-17 ENCOUNTER — Ambulatory Visit (INDEPENDENT_AMBULATORY_CARE_PROVIDER_SITE_OTHER): Payer: Medicare HMO

## 2019-06-17 ENCOUNTER — Other Ambulatory Visit: Payer: Self-pay

## 2019-06-17 DIAGNOSIS — I6523 Occlusion and stenosis of bilateral carotid arteries: Secondary | ICD-10-CM

## 2019-06-19 ENCOUNTER — Other Ambulatory Visit: Payer: Self-pay | Admitting: Cardiology

## 2019-06-19 DIAGNOSIS — I6523 Occlusion and stenosis of bilateral carotid arteries: Secondary | ICD-10-CM

## 2019-06-23 ENCOUNTER — Other Ambulatory Visit: Payer: Self-pay

## 2019-06-23 ENCOUNTER — Ambulatory Visit (INDEPENDENT_AMBULATORY_CARE_PROVIDER_SITE_OTHER): Payer: Medicare HMO | Admitting: Cardiology

## 2019-06-23 ENCOUNTER — Encounter: Payer: Self-pay | Admitting: Cardiology

## 2019-06-23 VITALS — BP 140/70 | HR 60 | Ht 68.5 in | Wt 181.0 lb

## 2019-06-23 DIAGNOSIS — I350 Nonrheumatic aortic (valve) stenosis: Secondary | ICD-10-CM | POA: Diagnosis not present

## 2019-06-23 DIAGNOSIS — R0609 Other forms of dyspnea: Secondary | ICD-10-CM

## 2019-06-23 DIAGNOSIS — R0600 Dyspnea, unspecified: Secondary | ICD-10-CM

## 2019-06-23 DIAGNOSIS — I6523 Occlusion and stenosis of bilateral carotid arteries: Secondary | ICD-10-CM | POA: Diagnosis not present

## 2019-06-23 DIAGNOSIS — E7800 Pure hypercholesterolemia, unspecified: Secondary | ICD-10-CM
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
## 2019-07-01 ENCOUNTER — Encounter: Payer: Self-pay | Admitting: Obstetrics and Gynecology

## 2019-07-01 DIAGNOSIS — Z1231 Encounter for screening mammogram for malignant neoplasm of breast: Secondary | ICD-10-CM | POA: Diagnosis not present

## 2019-07-02 ENCOUNTER — Encounter: Payer: Self-pay | Admitting: Gastroenterology

## 2019-07-04 ENCOUNTER — Other Ambulatory Visit: Payer: Self-pay

## 2019-07-08 ENCOUNTER — Encounter: Payer: Self-pay | Admitting: Obstetrics and Gynecology

## 2019-07-08 DIAGNOSIS — N6001 Solitary cyst of right breast: Secondary | ICD-10-CM | POA: Diagnosis not present

## 2019-07-08 DIAGNOSIS — N6314 Unspecified lump in the right breast, lower inner quadrant: Secondary | ICD-10-CM | POA: Diagnosis not present
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
## 2019-07-09 ENCOUNTER — Other Ambulatory Visit: Payer: Self-pay

## 2019-07-09 ENCOUNTER — Encounter: Payer: Self-pay | Admitting: Obstetrics and Gynecology

## 2019-07-09 ENCOUNTER — Other Ambulatory Visit (HOSPITAL_COMMUNITY)
Admission: RE | Admit: 2019-07-09 | Discharge: 2019-07-09 | Disposition: A | Discharge status: Home / Self Care | Payer: Medicare HMO | Source: Ambulatory Visit | Attending: Obstetrics and Gynecology | Admitting: Obstetrics and Gynecology

## 2019-07-09 ENCOUNTER — Ambulatory Visit (INDEPENDENT_AMBULATORY_CARE_PROVIDER_SITE_OTHER): Payer: Medicare HMO | Admitting: Obstetrics and Gynecology

## 2019-07-09 VITALS — BP 140/82 | HR 60 | Temp 97.3°F | Resp 16 | Ht 68.0 in | Wt 181.0 lb

## 2019-07-09 DIAGNOSIS — Z9189 Other specified personal risk factors, not elsewhere classified: Secondary | ICD-10-CM | POA: Insufficient documentation

## 2019-07-09 DIAGNOSIS — Z01419 Encounter for gynecological examination (general) (routine) without abnormal findings: Secondary | ICD-10-CM

## 2019-07-09 MED ORDER — VALACYCLOVIR HCL 500 MG PO TABS: ORAL_TABLET | ORAL | 1 refills | Status: DC

## 2019-07-09 NOTE — Patient Instructions (Signed)

## 2019-07-10 LAB — CYTOLOGY - PAP: Diagnosis: NEGATIVE

## 2019-07-11 ENCOUNTER — Telehealth: Payer: Self-pay | Admitting: Gastroenterology

## 2019-07-11 NOTE — Telephone Encounter (Signed)

## 2019-07-14 ENCOUNTER — Other Ambulatory Visit: Payer: Self-pay

## 2019-07-14 ENCOUNTER — Encounter: Payer: Self-pay | Admitting: Gastroenterology

## 2019-07-14 ENCOUNTER — Ambulatory Visit (AMBULATORY_SURGERY_CENTER): Payer: Medicare HMO | Admitting: Gastroenterology

## 2019-07-14 VITALS — BP 148/69 | HR 54 | Temp 98.4°F | Resp 18 | Ht 68.0 in | Wt 183.0 lb

## 2019-07-14 DIAGNOSIS — K5730 Diverticulosis of large intestine without perforation or abscess without bleeding: Secondary | ICD-10-CM

## 2019-07-14 DIAGNOSIS — K621 Rectal polyp: Secondary | ICD-10-CM

## 2019-07-14 DIAGNOSIS — K648 Other hemorrhoids: Secondary | ICD-10-CM

## 2019-07-14 DIAGNOSIS — K633 Ulcer of intestine: Secondary | ICD-10-CM

## 2019-07-14 DIAGNOSIS — I10 Essential (primary) hypertension: Secondary | ICD-10-CM | POA: Diagnosis not present

## 2019-07-14 DIAGNOSIS — D120 Benign neoplasm of cecum: Secondary | ICD-10-CM

## 2019-07-14 DIAGNOSIS — K625 Hemorrhage of anus and rectum: Secondary | ICD-10-CM

## 2019-07-14 DIAGNOSIS — D128 Benign neoplasm of rectum: Secondary | ICD-10-CM

## 2019-07-14 DIAGNOSIS — K51911 Ulcerative colitis, unspecified with rectal bleeding: Secondary | ICD-10-CM | POA: Diagnosis not present

## 2019-07-14 DIAGNOSIS — E119 Type 2 diabetes mellitus without complications: Secondary | ICD-10-CM | POA: Diagnosis not present

## 2019-07-14 MED ORDER — SODIUM CHLORIDE 0.9 % IV SOLN: 500.0000 mL | Freq: Once | INTRAVENOUS | Status: DC

## 2019-07-14 MED ORDER — HYDROCORTISONE ACETATE 25 MG RE SUPP: 25.0000 mg | Freq: Every day | RECTAL | 0 refills | Status: DC

## 2019-07-14 NOTE — Patient Instructions (Addendum)
YOU HAD AN ENDOSCOPIC PROCEDURE TODAY AT [facility]:   Refer to the procedure report that was given to you for any specific questions about what was found during the examination.  If the procedure report does not answer your questions, please call your gastroenterologist to clarify.  If you requested that your care partner not be given the details of your procedure findings, then the procedure report has been included in a sealed envelope for you to review at your convenience later.  YOU SHOULD EXPECT: Some feelings of bloating in the abdomen. Passage of more gas than usual.  Walking can help get rid of the air that was put into your GI tract during the procedure and reduce the bloating. If you had a lower endoscopy (such as a colonoscopy or flexible sigmoidoscopy) you may notice spotting of blood in your stool or on the toilet paper. If you underwent a bowel prep for your procedure, you may not have a normal bowel movement for a few days.  Please Note:  You might notice some irritation and congestion in your nose or some drainage.  This is from the oxygen used during your procedure.  There is no need for concern and it should clear up in a day or so.  SYMPTOMS TO REPORT IMMEDIATELY:   Following lower endoscopy (colonoscopy or flexible sigmoidoscopy):  Excessive amounts of blood in the stool  Significant tenderness or worsening of abdominal pains  Swelling of the abdomen that is new, acute  Fever of 100F or higher   For urgent or emergent issues, a gastroenterologist can be reached at any hour by calling [phone].   DIET:  We do recommend a small meal at first, but then you may proceed to your regular diet.  Drink plenty of fluids but you should avoid alcoholic beverages for 24 hours.  ACTIVITY:  You should plan to take it easy for the rest of today and you should NOT DRIVE or use heavy machinery until tomorrow (because of the sedation medicines used during the test).     FOLLOW UP: Our staff will call the number listed on your records 48-72 hours following your procedure to check on you and address any questions or concerns that you may have regarding the information given to you following your procedure. If we do not reach you, we will leave a message.  We will attempt to reach you two times.  During this call, we will ask if you have developed any symptoms of COVID 19. If you develop any symptoms (ie: fever, flu-like symptoms, shortness of breath, cough etc.) before then, please call [phone].  If you test positive for Covid 19 in the 2 weeks post procedure, please call and report this information to [place].    If any biopsies were taken you will be contacted by phone or by letter within the next 1-3 weeks.  Please call us at [phone] if you have not heard about the biopsies in 3 weeks.    SIGNATURES/CONFIDENTIALITY: You and/or your care partner have signed paperwork which will be entered into your electronic medical record.  These signatures attest to the fact that that the information above on your After Visit Summary has been reviewed and is understood.  Full responsibility of the confidentiality of this discharge information lies with you and/or your care-partner.    Handouts were given to you on polyps, diverticulosis, and hemorrhoids. Your blood sugar was 132 in the recovery room. NO ASPIRIN, ASPIRIN CONTAINING PRODUCTS (  BC OR GOODY POWDERS) OR NSAIDS (IBUPROFEN, ADVIL, ALEVE, AND MOTRIN); TYLENOL IS OK TO TAKE if needed Add OTC BENEFIBER - fiber supplement 2 teaspoons by mouth 3 x per day. Rx was sent to your pharmancy for HYDROCORTISONE SUPPOSITORY use daily at bedtime  for 1 week. Refer to colorectal surgeon for management of symptomatic large grade 3 hemorrhoids. You may resume your other current medications today. Await biopsy results. Please call if any questions or concerns.

## 2019-07-14 NOTE — Progress Notes (Signed)
Report given to PACU, vss 

## 2019-07-14 NOTE — Op Note (Signed)
[place] Patient Name: Mary Reed Procedure Date: 07/14/2019 9:02 AM MRN: [id number] Endoscopist: Mauri Pole , MD Age: 68 Referring MD:  Date of Birth: 12/23/1950 Gender: Female Account #: [id number] Procedure:                Colonoscopy Indications:              Evaluation of unexplained GI bleeding presenting                            with Hematochezia Medicines:                Monitored Anesthesia Care Procedure:                Pre-Anesthesia Assessment:                           - Prior to the procedure, a History and Physical                            was performed, and patient medications and                            allergies were reviewed. The patient's tolerance of                            previous anesthesia was also reviewed. The risks                            and benefits of the procedure and the sedation                            options and risks were discussed with the patient.                            All questions were answered, and informed consent                            was obtained. Prior Anticoagulants: The patient has                            taken no previous anticoagulant or antiplatelet                            agents. ASA Grade Assessment: II - A patient with                            mild systemic disease. After reviewing the risks                            and benefits, the patient was deemed in                            satisfactory condition to undergo the procedure.  After obtaining informed consent, the colonoscope                            was passed under direct vision. Throughout the                            procedure, the patient's blood pressure, pulse, and                            oxygen saturations were monitored continuously. The                            Colonoscope was introduced through the anus and                            advanced to the the terminal ileum,  with                            identification of the appendiceal orifice and IC                            valve. The colonoscopy was performed without                            difficulty. The patient tolerated the procedure                            well. The quality of the bowel preparation was                            adequate. The terminal ileum, ileocecal valve,                            appendiceal orifice, and rectum were photographed. Scope In: 9:08:29 AM Scope Out: 9:33:23 AM Scope Withdrawal Time: 0 hours 16 minutes 28 seconds  Total Procedure Duration: 0 hours 24 minutes 54 seconds  Findings:                 Hemorrhoids were found on perianal exam.                           The terminal ileum contained multiple six mm                            ulcers. No bleeding was present. Stigmata of recent                            bleeding were present. Biopsies were taken with a                            cold forceps for histology.                           Scattered small and large-mouthed diverticula were  found in the sigmoid colon, descending colon,                            transverse colon and ascending colon.                           A 7 mm polyp was found in the rectum. The polyp was                            sessile. The polyp was removed with a cold snare.                            Resection and retrieval were complete.                           Bleeding external and internal hemorrhoids were                            found during retroflexion. The hemorrhoids were                            large.                           A localized area of granular mucosa was found at                            the anus likely secondary to protrusion of                            hemorrhoids. Biopsies were taken with a cold                            forceps for histology and exclude anal intra                            epithelial  neoplasia. Complications:            No immediate complications. Estimated Blood Loss:     Estimated blood loss was minimal. Impression:               - Hemorrhoids found on perianal exam.                           - Multiple ulcers in the terminal ileum. Biopsied.                           - Moderate diverticulosis in the sigmoid colon, in                            the descending colon, in the transverse colon and                            in the ascending colon.                           -  One 7 mm polyp in the rectum, removed with a cold                            snare. Resected and retrieved.                           - Bleeding external and internal hemorrhoids.                           - Granularity at the anus. Biopsied. Recommendation:           - Patient has a contact number available for                            emergencies. The signs and symptoms of potential                            delayed complications were discussed with the                            patient. Return to normal activities tomorrow.                            Written discharge instructions were provided to the                            patient.                           - Resume previous diet.                           - Continue present medications.                           - Await pathology results.                           - Repeat colonoscopy date to be determined after                            pending pathology results are reviewed for                            surveillance based on pathology results.                           - No high dose aspirin, ibuprofen, naproxen, or                            other non-steroidal anti-inflammatory drugs.                           - Use Benefiber two teaspoons PO TID.                           -  Use hydrocortisone suppository 25 mg 1 per rectum                            once a day for 1 week.                           - Refer to colorectal surgery  for management of                            symptomatic large Grade III hemorrhoids Mauri Pole, MD 07/14/2019 9:41:19 AM This report has been signed electronically.

## 2019-07-14 NOTE — Progress Notes (Signed)
Called to room to assist during endoscopic procedure.  Patient ID and intended procedure confirmed with present staff. Received instructions for my participation in the procedure from the performing physician.  

## 2019-07-14 NOTE — Progress Notes (Signed)
No problems noted in the recovery room. maw 

## 2019-07-16 ENCOUNTER — Telehealth: Payer: Self-pay | Admitting: *Deleted

## 2019-07-16 NOTE — Telephone Encounter (Signed)
  Follow up Call-  Call back number 07/14/2019  Post procedure Call Back phone  # [phone]  Permission to leave phone message Yes  Some recent data might be hidden     Patient questions:  Do you have a fever, pain , or abdominal swelling? No. Pain Score  0 *  Have you tolerated food without any problems? Yes.    Have you been able to return to your normal activities? Yes.    Do you have any questions about your discharge instructions: Diet   No. Medications  No. Follow up visit  No.  Do you have questions or concerns about your Care? No.  Actions: * If pain score is 4 or above: No action needed, pain <4.  1. Have you developed a fever since your procedure? no  2.   Have you had an respiratory symptoms (SOB or cough) since your procedure? no  3.   Have you tested positive for COVID 19 since your procedure no  4.   Have you had any family members/close contacts diagnosed with the COVID 19 since your procedure?  no   If yes to any of these questions please route to Joylene John, RN and Alphonsa Gin, Therapist, sports.

## 2019-07-17 ENCOUNTER — Telehealth: Payer: Self-pay

## 2019-07-17 ENCOUNTER — Other Ambulatory Visit: Payer: Self-pay

## 2019-07-17 DIAGNOSIS — Z20822 Contact with and (suspected) exposure to covid-19: Secondary | ICD-10-CM

## 2019-07-17 DIAGNOSIS — R69 Illness, unspecified: Secondary | ICD-10-CM | POA: Diagnosis not present

## 2019-07-17 DIAGNOSIS — R6889 Other general symptoms and signs: Secondary | ICD-10-CM | POA: Diagnosis not present

## 2019-07-17 NOTE — Telephone Encounter (Signed)
Records faxed to [facility]. Requesting an appointment for symptomatic large grade III hemorrhoids.

## 2019-07-19 LAB — SPECIMEN STATUS REPORT

## 2019-07-19 LAB — NOVEL CORONAVIRUS, NAA: SARS-CoV-2, NAA: NOT DETECTED

## 2019-08-11 DIAGNOSIS — E119 Type 2 diabetes mellitus without complications: Secondary | ICD-10-CM | POA: Diagnosis not present

## 2019-08-11 DIAGNOSIS — H43813 Vitreous degeneration, bilateral: Secondary | ICD-10-CM | POA: Diagnosis not present

## 2019-08-11 DIAGNOSIS — H25813 Combined forms of age-related cataract, bilateral: Secondary | ICD-10-CM | POA: Diagnosis not present

## 2019-08-20 DIAGNOSIS — E119 Type 2 diabetes mellitus without complications: Secondary | ICD-10-CM | POA: Diagnosis not present

## 2019-08-20 DIAGNOSIS — I10 Essential (primary) hypertension: Secondary | ICD-10-CM | POA: Diagnosis not present

## 2019-08-20 DIAGNOSIS — E7800 Pure hypercholesterolemia, unspecified: Secondary | ICD-10-CM | POA: Diagnosis not present

## 2019-09-15 DIAGNOSIS — M545 Low back pain: Secondary | ICD-10-CM | POA: Diagnosis not present

## 2019-09-17 ENCOUNTER — Other Ambulatory Visit: Payer: Self-pay

## 2019-09-17 DIAGNOSIS — Z20822 Contact with and (suspected) exposure to covid-19: Secondary | ICD-10-CM

## 2019-09-18 LAB — NOVEL CORONAVIRUS, NAA: SARS-CoV-2, NAA: NOT DETECTED

## 2019-09-26 ENCOUNTER — Other Ambulatory Visit: Payer: Self-pay

## 2019-09-26 DIAGNOSIS — Z20822 Contact with and (suspected) exposure to covid-19: Secondary | ICD-10-CM

## 2019-09-28 LAB — NOVEL CORONAVIRUS, NAA: SARS-CoV-2, NAA: NOT DETECTED

## 2019-11-24 DIAGNOSIS — D509 Iron deficiency anemia, unspecified: Secondary | ICD-10-CM | POA: Diagnosis not present

## 2019-11-24 DIAGNOSIS — E559 Vitamin D deficiency, unspecified: Secondary | ICD-10-CM | POA: Diagnosis not present

## 2019-11-24 DIAGNOSIS — E7800 Pure hypercholesterolemia, unspecified: Secondary | ICD-10-CM | POA: Diagnosis not present

## 2019-11-24 DIAGNOSIS — E118 Type 2 diabetes mellitus with unspecified complications: Secondary | ICD-10-CM | POA: Diagnosis not present

## 2019-11-24 DIAGNOSIS — I10 Essential (primary) hypertension: Secondary | ICD-10-CM | POA: Diagnosis not present

## 2019-11-26 DIAGNOSIS — I10 Essential (primary) hypertension: Secondary | ICD-10-CM | POA: Diagnosis not present

## 2019-11-26 DIAGNOSIS — E118 Type 2 diabetes mellitus with unspecified complications: Secondary | ICD-10-CM | POA: Diagnosis not present

## 2019-11-26 DIAGNOSIS — D509 Iron deficiency anemia, unspecified: Secondary | ICD-10-CM | POA: Diagnosis not present

## 2019-11-26 DIAGNOSIS — E559 Vitamin D deficiency, unspecified: Secondary | ICD-10-CM | POA: Diagnosis not present

## 2019-11-26 DIAGNOSIS — E7800 Pure hypercholesterolemia, unspecified: Secondary | ICD-10-CM | POA: Diagnosis not present

## 2019-11-26 DIAGNOSIS — Z03818 Encounter for observation for suspected exposure to other biological agents ruled out: Secondary | ICD-10-CM | POA: Diagnosis not present

## 2019-12-18 DIAGNOSIS — M419 Scoliosis, unspecified: Secondary | ICD-10-CM | POA: Diagnosis not present

## 2019-12-18 DIAGNOSIS — M47816 Spondylosis without myelopathy or radiculopathy, lumbar region: Secondary | ICD-10-CM | POA: Diagnosis not present

## 2019-12-22 ENCOUNTER — Ambulatory Visit (INDEPENDENT_AMBULATORY_CARE_PROVIDER_SITE_OTHER): Payer: Medicare HMO

## 2019-12-22 ENCOUNTER — Other Ambulatory Visit: Payer: Self-pay

## 2019-12-22 DIAGNOSIS — I6523 Occlusion and stenosis of bilateral carotid arteries: Secondary | ICD-10-CM | POA: Diagnosis not present

## 2019-12-22 DIAGNOSIS — M48061 Spinal stenosis, lumbar region without neurogenic claudication: Secondary | ICD-10-CM | POA: Diagnosis not present

## 2019-12-29 ENCOUNTER — Ambulatory Visit: Payer: Medicare HMO | Admitting: Cardiology

## 2019-12-29 ENCOUNTER — Other Ambulatory Visit: Payer: Self-pay

## 2019-12-29 ENCOUNTER — Encounter: Payer: Self-pay | Admitting: Cardiology

## 2019-12-29 VITALS — BP 132/78 | HR 62 | Temp 98.2°F | Resp 16 | Ht 68.0 in | Wt 187.4 lb

## 2019-12-29 DIAGNOSIS — R0609 Other forms of dyspnea: Secondary | ICD-10-CM | POA: Diagnosis not present

## 2019-12-29 DIAGNOSIS — M545 Low back pain: Secondary | ICD-10-CM | POA: Diagnosis not present

## 2019-12-29 DIAGNOSIS — M5416 Radiculopathy, lumbar region: Secondary | ICD-10-CM | POA: Diagnosis not present

## 2019-12-29 DIAGNOSIS — E7800 Pure hypercholesterolemia, unspecified: Secondary | ICD-10-CM | POA: Diagnosis not present

## 2019-12-29 DIAGNOSIS — I350 Nonrheumatic aortic (valve) stenosis: Secondary | ICD-10-CM

## 2019-12-29 DIAGNOSIS — R0600 Dyspnea, unspecified: Secondary | ICD-10-CM

## 2019-12-29 DIAGNOSIS — I6523 Occlusion and stenosis of bilateral carotid arteries: Secondary | ICD-10-CM

## 2019-12-29 DIAGNOSIS — M48061 Spinal stenosis, lumbar region without neurogenic claudication: Secondary | ICD-10-CM | POA: Diagnosis not present
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
## 2020-01-07 DIAGNOSIS — M48061 Spinal stenosis, lumbar region without neurogenic claudication: Secondary | ICD-10-CM | POA: Diagnosis not present

## 2020-01-07 DIAGNOSIS — M545 Low back pain: Secondary | ICD-10-CM | POA: Diagnosis not present

## 2020-01-07 DIAGNOSIS — M5416 Radiculopathy, lumbar region: Secondary | ICD-10-CM | POA: Diagnosis not present

## 2020-01-12 DIAGNOSIS — Z1159 Encounter for screening for other viral diseases: Secondary | ICD-10-CM | POA: Diagnosis not present

## 2020-01-12 DIAGNOSIS — I10 Essential (primary) hypertension: Secondary | ICD-10-CM | POA: Diagnosis not present

## 2020-01-12 DIAGNOSIS — D509 Iron deficiency anemia, unspecified: Secondary | ICD-10-CM | POA: Diagnosis not present

## 2020-01-19 DIAGNOSIS — K219 Gastro-esophageal reflux disease without esophagitis: Secondary | ICD-10-CM | POA: Diagnosis not present

## 2020-01-19 DIAGNOSIS — E611 Iron deficiency: Secondary | ICD-10-CM | POA: Diagnosis not present

## 2020-01-19 DIAGNOSIS — K649 Unspecified hemorrhoids: Secondary | ICD-10-CM | POA: Diagnosis not present

## 2020-01-19 DIAGNOSIS — E559 Vitamin D deficiency, unspecified: Secondary | ICD-10-CM | POA: Diagnosis not present

## 2020-01-19 DIAGNOSIS — E7800 Pure hypercholesterolemia, unspecified: Secondary | ICD-10-CM | POA: Diagnosis not present

## 2020-01-19 DIAGNOSIS — R69 Illness, unspecified: Secondary | ICD-10-CM | POA: Diagnosis not present

## 2020-01-19 DIAGNOSIS — I6529 Occlusion and stenosis of unspecified carotid artery: Secondary | ICD-10-CM | POA: Diagnosis not present

## 2020-01-19 DIAGNOSIS — I10 Essential (primary) hypertension: Secondary | ICD-10-CM | POA: Diagnosis not present

## 2020-01-19 DIAGNOSIS — Z0000 Encounter for general adult medical examination without abnormal findings: Secondary | ICD-10-CM | POA: Diagnosis not present

## 2020-01-20 DIAGNOSIS — M5416 Radiculopathy, lumbar region: Secondary | ICD-10-CM | POA: Diagnosis not present

## 2020-01-20 DIAGNOSIS — M545 Low back pain: Secondary | ICD-10-CM | POA: Diagnosis not present

## 2020-01-20 DIAGNOSIS — M48061 Spinal stenosis, lumbar region without neurogenic claudication: Secondary | ICD-10-CM | POA: Diagnosis not present

## 2020-01-21 ENCOUNTER — Telehealth: Payer: Self-pay | Admitting: Gastroenterology

## 2020-02-04 DIAGNOSIS — M47816 Spondylosis without myelopathy or radiculopathy, lumbar region: Secondary | ICD-10-CM | POA: Diagnosis not present

## 2020-02-04 DIAGNOSIS — M545 Low back pain: Secondary | ICD-10-CM | POA: Diagnosis not present

## 2020-02-09 DIAGNOSIS — H52203 Unspecified astigmatism, bilateral: Secondary | ICD-10-CM | POA: Diagnosis not present

## 2020-02-09 DIAGNOSIS — E119 Type 2 diabetes mellitus without complications: Secondary | ICD-10-CM | POA: Diagnosis not present

## 2020-02-09 DIAGNOSIS — H25813 Combined forms of age-related cataract, bilateral: Secondary | ICD-10-CM | POA: Diagnosis not present

## 2020-02-09 DIAGNOSIS — H43813 Vitreous degeneration, bilateral: Secondary | ICD-10-CM | POA: Diagnosis not present

## 2020-03-02 ENCOUNTER — Ambulatory Visit: Payer: Medicare HMO | Admitting: Gastroenterology

## 2020-03-02 ENCOUNTER — Encounter: Payer: Self-pay | Admitting: Gastroenterology

## 2020-03-02 VITALS — BP 140/80 | HR 80 | Temp 97.5°F | Ht 68.0 in | Wt 186.5 lb

## 2020-03-02 DIAGNOSIS — K625 Hemorrhage of anus and rectum: Secondary | ICD-10-CM

## 2020-03-02 DIAGNOSIS — K641 Second degree hemorrhoids: Secondary | ICD-10-CM | POA: Diagnosis not present
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
## 2020-03-02 NOTE — Patient Instructions (Addendum)
   If you are age 71 or older, your body mass index should be between 23-30. Your Body mass index is 28.36 kg/m. If this is out of the aforementioned range listed, please consider follow up with your Primary Care Provider.  If you are age 59 or younger, your body mass index should be between 19-25. Your Body mass index is 28.36 kg/m. If this is out of the aformentioned range listed, please consider follow up with your Primary Care Provider.    HEMORRHOID BANDING PROCEDURE    FOLLOW-UP CARE   1. The procedure you have had should have been relatively painless since the banding of the area involved does not have nerve endings and there is no pain sensation.  The rubber band cuts off the blood supply to the hemorrhoid and the band may fall off as soon as 48 hours after the banding (the band may occasionally be seen in the toilet bowl following a bowel movement). You may notice a temporary feeling of fullness in the rectum which should respond adequately to plain Tylenol or Motrin.  2. Following the banding, avoid strenuous exercise that evening and resume full activity the next day.  A sitz bath (soaking in a warm tub) or bidet is soothing, and can be useful for cleansing the area after bowel movements.     3. To avoid constipation, take two tablespoons of natural wheat bran, natural oat bran, flax, Benefiber or any over the counter fiber supplement and increase your water intake to 7-8 glasses daily.    4. Unless you have been prescribed anorectal medication, do not put anything inside your rectum for two weeks: No suppositories, enemas, fingers, etc.  5. Occasionally, you may have more bleeding than usual after the banding procedure.  This is often from the untreated hemorrhoids rather than the treated one.  Don't be concerned if there is a tablespoon or so of blood.  If there is more blood than this, lie flat with your bottom higher than your head and apply an ice pack to the area. If the  bleeding does not stop within a half an hour or if you feel faint, call our office at [phone] or go to the emergency room.  6. Problems are not common; however, if there is a substantial amount of bleeding, severe pain, chills, fever or difficulty passing urine (very rare) or other problems, you should call us at [phone] or report to the nearest emergency room.  7. Do not stay seated continuously for more than 2-3 hours for a day or two after the procedure.  Tighten your buttock muscles 10-15 times every two hours and take 10-15 deep breaths every 1-2 hours.  Do not spend more than a few minutes on the toilet if you cannot empty your bowel; instead re-visit the toilet at a later time.    A high fiber diet with plenty of fluids (up to 8 glasses of water daily) is suggested to relieve these symptoms.  Benefiber, 1 tablespoon once or twice daily can be used to keep bowels regular if needed.  Thank you for choosing me and [facility].  Dr.Nandigam

## 2020-03-03 ENCOUNTER — Encounter: Payer: Self-pay | Admitting: Gastroenterology

## 2020-03-08 DIAGNOSIS — M48061 Spinal stenosis, lumbar region without neurogenic claudication: Secondary | ICD-10-CM | POA: Diagnosis not present

## 2020-03-08 DIAGNOSIS — M47816 Spondylosis without myelopathy or radiculopathy, lumbar region: Secondary | ICD-10-CM | POA: Diagnosis not present

## 2020-03-08 DIAGNOSIS — M545 Low back pain: Secondary | ICD-10-CM | POA: Diagnosis not present

## 2020-03-17 ENCOUNTER — Encounter: Payer: Self-pay | Admitting: Gastroenterology

## 2020-03-17 ENCOUNTER — Ambulatory Visit: Payer: Medicare HMO | Admitting: Gastroenterology

## 2020-03-17 VITALS — BP 130/64 | HR 74 | Temp 98.1°F | Ht 68.0 in | Wt 186.0 lb

## 2020-03-17 DIAGNOSIS — K641 Second degree hemorrhoids: Secondary | ICD-10-CM | POA: Diagnosis not present

## 2020-03-17 NOTE — Patient Instructions (Addendum)
If you are age 71 or older, your body mass index should be between 23-30. Your Body mass index is 28.28 kg/m. If this is out of the aforementioned range listed, please consider follow up with your Primary Care Provider.  If you are age 73 or younger, your body mass index should be between 19-25. Your Body mass index is 28.28 kg/m. If this is out of the aformentioned range listed, please consider follow up with your Primary Care Provider.   Continue Benefiber as directed.    HEMORRHOID BANDING PROCEDURE    FOLLOW-UP CARE   1. The procedure you have had should have been relatively painless since the banding of the area involved does not have nerve endings and there is no pain sensation.  The rubber band cuts off the blood supply to the hemorrhoid and the band may fall off as soon as 48 hours after the banding (the band may occasionally be seen in the toilet bowl following a bowel movement). You may notice a temporary feeling of fullness in the rectum which should respond adequately to plain Tylenol or Motrin.  2. Following the banding, avoid strenuous exercise that evening and resume full activity the next day.  A sitz bath (soaking in a warm tub) or bidet is soothing, and can be useful for cleansing the area after bowel movements.     3. To avoid constipation, take two tablespoons of natural wheat bran, natural oat bran, flax, Benefiber or any over the counter fiber supplement and increase your water intake to 7-8 glasses daily.    4. Unless you have been prescribed anorectal medication, do not put anything inside your rectum for two weeks: No suppositories, enemas, fingers, etc.  5. Occasionally, you may have more bleeding than usual after the banding procedure.  This is often from the untreated hemorrhoids rather than the treated one.  Don't be concerned if there is a tablespoon or so of blood.  If there is more blood than this, lie flat with your bottom higher than your head and apply an  ice pack to the area. If the bleeding does not stop within a half an hour or if you feel faint, call our office at [phone] or go to the emergency room.  6. Problems are not common; however, if there is a substantial amount of bleeding, severe pain, chills, fever or difficulty passing urine (very rare) or other problems, you should call us at [phone] or report to the nearest emergency room.  7. Do not stay seated continuously for more than 2-3 hours for a day or two after the procedure.  Tighten your buttock muscles 10-15 times every two hours and take 10-15 deep breaths every 1-2 hours.  Do not spend more than a few minutes on the toilet if you cannot empty your bowel; instead re-visit the toilet at a later time.     Thank you for choosing me and [facility].  Dr. Silverio Decamp

## 2020-03-17 NOTE — Progress Notes (Signed)
PROCEDURE NOTE: The patient presents with symptomatic grade II  hemorrhoids, requesting rubber band ligation of his/her hemorrhoidal disease.  All risks, benefits and alternative forms of therapy were described and informed consent was obtained.   The anorectum was pre-medicated with 0.125% NTG and Recticare   The decision was made to band the left lateral internal hemorrhoid, and the [place] was used to perform band ligation without complication.  Digital anorectal examination was then performed to assure proper positioning of the band, and to adjust the banded tissue as required.  The patient was discharged home without pain or other issues.  Dietary and behavioral recommendations were given and along with follow-up instructions.     The following adjunctive treatments were recommended:  Benefiber 1 tablespoon BID  The patient will return  follow-up and possible additional banding as needed. No complications were encountered and the patient tolerated the procedure well.  Damaris Hippo , MD [phone]

## 2020-03-18 DIAGNOSIS — M5416 Radiculopathy, lumbar region: Secondary | ICD-10-CM | POA: Diagnosis not present

## 2020-03-18 DIAGNOSIS — M47816 Spondylosis without myelopathy or radiculopathy, lumbar region: Secondary | ICD-10-CM | POA: Diagnosis not present

## 2020-03-18 DIAGNOSIS — M48061 Spinal stenosis, lumbar region without neurogenic claudication: Secondary | ICD-10-CM | POA: Diagnosis not present

## 2020-03-18 DIAGNOSIS — M545 Low back pain: Secondary | ICD-10-CM | POA: Diagnosis not present

## 2020-04-06 DIAGNOSIS — R69 Illness, unspecified: Secondary | ICD-10-CM | POA: Diagnosis not present

## 2020-04-09 ENCOUNTER — Encounter: Payer: Medicare HMO | Admitting: Gastroenterology

## 2020-04-12 DIAGNOSIS — M5416 Radiculopathy, lumbar region: Secondary | ICD-10-CM | POA: Diagnosis not present

## 2020-04-12 DIAGNOSIS — M47816 Spondylosis without myelopathy or radiculopathy, lumbar region: Secondary | ICD-10-CM | POA: Diagnosis not present

## 2020-04-12 DIAGNOSIS — M7071 Other bursitis of hip, right hip: Secondary | ICD-10-CM | POA: Diagnosis not present

## 2020-04-16 DIAGNOSIS — E559 Vitamin D deficiency, unspecified: Secondary | ICD-10-CM | POA: Diagnosis not present

## 2020-04-16 DIAGNOSIS — I10 Essential (primary) hypertension: Secondary | ICD-10-CM | POA: Diagnosis not present

## 2020-04-16 DIAGNOSIS — R7989 Other specified abnormal findings of blood chemistry: Secondary | ICD-10-CM | POA: Diagnosis not present

## 2020-04-16 DIAGNOSIS — Z79899 Other long term (current) drug therapy: Secondary | ICD-10-CM | POA: Diagnosis not present

## 2020-04-16 DIAGNOSIS — E611 Iron deficiency: Secondary | ICD-10-CM | POA: Diagnosis not present

## 2020-04-21 DIAGNOSIS — D509 Iron deficiency anemia, unspecified: Secondary | ICD-10-CM | POA: Diagnosis not present

## 2020-04-21 DIAGNOSIS — E559 Vitamin D deficiency, unspecified: Secondary | ICD-10-CM | POA: Diagnosis not present

## 2020-04-21 DIAGNOSIS — I10 Essential (primary) hypertension: Secondary | ICD-10-CM | POA: Diagnosis not present

## 2020-04-21 DIAGNOSIS — E7800 Pure hypercholesterolemia, unspecified: Secondary | ICD-10-CM | POA: Diagnosis not present

## 2020-04-21 DIAGNOSIS — E119 Type 2 diabetes mellitus without complications: Secondary | ICD-10-CM | POA: Diagnosis not present

## 2020-05-31 DIAGNOSIS — M7071 Other bursitis of hip, right hip: Secondary | ICD-10-CM | POA: Diagnosis not present

## 2020-05-31 DIAGNOSIS — M47816 Spondylosis without myelopathy or radiculopathy, lumbar region: Secondary | ICD-10-CM | POA: Diagnosis not present

## 2020-06-09 DIAGNOSIS — N390 Urinary tract infection, site not specified: Secondary | ICD-10-CM | POA: Diagnosis not present

## 2020-06-09 DIAGNOSIS — R300 Dysuria: Secondary | ICD-10-CM | POA: Diagnosis not present

## 2020-06-14 ENCOUNTER — Other Ambulatory Visit: Payer: Self-pay

## 2020-06-14 ENCOUNTER — Ambulatory Visit: Payer: Medicare HMO

## 2020-06-14 DIAGNOSIS — I350 Nonrheumatic aortic (valve) stenosis: Secondary | ICD-10-CM

## 2020-06-14 DIAGNOSIS — I6523 Occlusion and stenosis of bilateral carotid arteries: Secondary | ICD-10-CM

## 2020-06-20 ENCOUNTER — Other Ambulatory Visit: Payer: Self-pay | Admitting: Cardiology

## 2020-06-20 DIAGNOSIS — I6523 Occlusion and stenosis of bilateral carotid arteries: Secondary | ICD-10-CM

## 2020-06-28 ENCOUNTER — Ambulatory Visit: Payer: Medicare HMO

## 2020-06-28 ENCOUNTER — Other Ambulatory Visit: Payer: Self-pay

## 2020-06-28 ENCOUNTER — Ambulatory Visit: Payer: Medicare HMO | Admitting: Cardiology

## 2020-06-28 ENCOUNTER — Encounter: Payer: Self-pay | Admitting: Cardiology

## 2020-06-28 VITALS — BP 137/65 | HR 58 | Resp 16 | Ht 68.0 in | Wt 184.0 lb

## 2020-06-28 DIAGNOSIS — R0600 Dyspnea, unspecified: Secondary | ICD-10-CM

## 2020-06-28 DIAGNOSIS — R002 Palpitations: Secondary | ICD-10-CM

## 2020-06-28 DIAGNOSIS — R0609 Other forms of dyspnea: Secondary | ICD-10-CM

## 2020-06-28 DIAGNOSIS — E7800 Pure hypercholesterolemia, unspecified: Secondary | ICD-10-CM | POA: Diagnosis not present

## 2020-06-28 DIAGNOSIS — I350 Nonrheumatic aortic (valve) stenosis: Secondary | ICD-10-CM | POA: Diagnosis not present

## 2020-06-28 DIAGNOSIS — I6523 Occlusion and stenosis of bilateral carotid arteries: Secondary | ICD-10-CM
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
## 2020-07-01 DIAGNOSIS — R69 Illness, unspecified: Secondary | ICD-10-CM | POA: Diagnosis not present

## 2020-07-16 ENCOUNTER — Other Ambulatory Visit (HOSPITAL_COMMUNITY)
Admission: RE | Admit: 2020-07-16 | Discharge: 2020-07-16 | Disposition: A | Discharge status: Home / Self Care | Payer: Medicare HMO | Source: Ambulatory Visit | Attending: Cardiology | Admitting: Cardiology

## 2020-07-16 DIAGNOSIS — R0600 Dyspnea, unspecified: Secondary | ICD-10-CM | POA: Diagnosis not present

## 2020-07-16 DIAGNOSIS — Z20822 Contact with and (suspected) exposure to covid-19: Secondary | ICD-10-CM | POA: Insufficient documentation

## 2020-07-16 DIAGNOSIS — I350 Nonrheumatic aortic (valve) stenosis: Secondary | ICD-10-CM | POA: Diagnosis not present

## 2020-07-16 DIAGNOSIS — Z01812 Encounter for preprocedural laboratory examination: Secondary | ICD-10-CM | POA: Insufficient documentation

## 2020-07-16 LAB — SARS CORONAVIRUS 2 (TAT 6-24 HRS): SARS Coronavirus 2: NEGATIVE

## 2020-07-17 LAB — BASIC METABOLIC PANEL
BUN/Creatinine Ratio: 14 (ref 12–28)
BUN: 10 mg/dL (ref 8–27)
CO2: 25 mmol/L (ref 20–29)
Calcium: 8.9 mg/dL (ref 8.7–10.3)
Chloride: 102 mmol/L (ref 96–106)
Creatinine, Ser: 0.71 mg/dL (ref 0.57–1.00)
GFR calc Af Amer: 100 mL/min/{1.73_m2} (ref >59)
GFR calc non Af Amer: 87 mL/min/{1.73_m2} (ref >59)
Glucose: 86 mg/dL (ref 65–99)
Potassium: 3.5 mmol/L (ref 3.5–5.2)
Sodium: 140 mmol/L (ref 134–144)

## 2020-07-17 LAB — CBC
Hematocrit: 36.3 % (ref 34.0–46.6)
Hemoglobin: 12 g/dL (ref 11.1–15.9)
MCH: 28.2 pg (ref 26.6–33.0)
MCHC: 33.1 g/dL (ref 31.5–35.7)
MCV: 85 fL (ref 79–97)
Platelets: 206 10*3/uL (ref 150–450)
RBC: 4.25 x10E6/uL (ref 3.77–5.28)
RDW: 14.4 % (ref 11.7–15.4)
WBC: 8.3 10*3/uL (ref 3.4–10.8)

## 2020-07-19 DIAGNOSIS — I350 Nonrheumatic aortic (valve) stenosis: Secondary | ICD-10-CM

## 2020-07-20 ENCOUNTER — Ambulatory Visit (HOSPITAL_COMMUNITY)
Admission: RE | Admit: 2020-07-20 | Discharge: 2020-07-20 | Disposition: A | Discharge status: Home / Self Care | Payer: Medicare HMO | Attending: Cardiology | Admitting: Cardiology

## 2020-07-20 ENCOUNTER — Other Ambulatory Visit: Payer: Self-pay

## 2020-07-20 ENCOUNTER — Encounter (HOSPITAL_COMMUNITY)
Admission: RE | Disposition: A | Discharge status: Home / Self Care | Payer: Self-pay | Source: Home / Self Care | Attending: Cardiology

## 2020-07-20 DIAGNOSIS — Z79899 Other long term (current) drug therapy: Secondary | ICD-10-CM | POA: Diagnosis not present

## 2020-07-20 DIAGNOSIS — E119 Type 2 diabetes mellitus without complications: Secondary | ICD-10-CM | POA: Insufficient documentation

## 2020-07-20 DIAGNOSIS — R002 Palpitations: Secondary | ICD-10-CM | POA: Diagnosis not present

## 2020-07-20 DIAGNOSIS — E785 Hyperlipidemia, unspecified: Secondary | ICD-10-CM | POA: Insufficient documentation

## 2020-07-20 DIAGNOSIS — I10 Essential (primary) hypertension: Secondary | ICD-10-CM | POA: Insufficient documentation

## 2020-07-20 DIAGNOSIS — E7800 Pure hypercholesterolemia, unspecified: Secondary | ICD-10-CM | POA: Insufficient documentation

## 2020-07-20 DIAGNOSIS — R0600 Dyspnea, unspecified: Secondary | ICD-10-CM | POA: Diagnosis present

## 2020-07-20 DIAGNOSIS — Z87891 Personal history of nicotine dependence: Secondary | ICD-10-CM | POA: Insufficient documentation

## 2020-07-20 DIAGNOSIS — Z7982 Long term (current) use of aspirin: Secondary | ICD-10-CM | POA: Diagnosis not present

## 2020-07-20 DIAGNOSIS — I6523 Occlusion and stenosis of bilateral carotid arteries: Secondary | ICD-10-CM | POA: Insufficient documentation

## 2020-07-20 DIAGNOSIS — I350 Nonrheumatic aortic (valve) stenosis: Secondary | ICD-10-CM | POA: Diagnosis not present

## 2020-07-20 DIAGNOSIS — K219 Gastro-esophageal reflux disease without esophagitis: Secondary | ICD-10-CM | POA: Insufficient documentation

## 2020-07-20 HISTORY — PX: RIGHT/LEFT HEART CATH AND CORONARY ANGIOGRAPHY: CATH118266

## 2020-07-20 LAB — POCT I-STAT EG7
Acid-Base Excess: 1 mmol/L (ref 0.0–2.0)
Acid-Base Excess: 1 mmol/L (ref 0.0–2.0)
Bicarbonate: 26.8 mmol/L (ref 20.0–28.0)
Bicarbonate: 27.1 mmol/L (ref 20.0–28.0)
Calcium, Ion: 1.19 mmol/L (ref 1.15–1.40)
Calcium, Ion: 1.19 mmol/L (ref 1.15–1.40)
HCT: 35 % — ABNORMAL LOW (ref 36.0–46.0)
HCT: 35 % — ABNORMAL LOW (ref 36.0–46.0)
Hemoglobin: 11.9 g/dL — ABNORMAL LOW (ref 12.0–15.0)
Hemoglobin: 11.9 g/dL — ABNORMAL LOW (ref 12.0–15.0)
O2 Saturation: 73 %
O2 Saturation: 74 %
Potassium: 3.2 mmol/L — ABNORMAL LOW (ref 3.5–5.1)
Potassium: 3.2 mmol/L — ABNORMAL LOW (ref 3.5–5.1)
Sodium: 145 mmol/L (ref 135–145)
Sodium: 145 mmol/L (ref 135–145)
TCO2: 28 mmol/L (ref 22–32)
TCO2: 29 mmol/L (ref 22–32)
pCO2, Ven: 48.5 mmHg (ref 44.0–60.0)
pCO2, Ven: 48.8 mmHg (ref 44.0–60.0)
pH, Ven: 7.351 (ref 7.250–7.430)
pH, Ven: 7.352 (ref 7.250–7.430)
pO2, Ven: 41 mmHg (ref 32.0–45.0)
pO2, Ven: 42 mmHg (ref 32.0–45.0)

## 2020-07-20 LAB — POCT I-STAT 7, (LYTES, BLD GAS, ICA,H+H)
Acid-Base Excess: 0 mmol/L (ref 0.0–2.0)
Bicarbonate: 26 mmol/L (ref 20.0–28.0)
Calcium, Ion: 1.2 mmol/L (ref 1.15–1.40)
HCT: 33 % — ABNORMAL LOW (ref 36.0–46.0)
Hemoglobin: 11.2 g/dL — ABNORMAL LOW (ref 12.0–15.0)
O2 Saturation: 96 %
Potassium: 3.2 mmol/L — ABNORMAL LOW (ref 3.5–5.1)
Sodium: 145 mmol/L (ref 135–145)
TCO2: 27 mmol/L (ref 22–32)
pCO2 arterial: 48.5 mmHg — ABNORMAL HIGH (ref 32.0–48.0)
pH, Arterial: 7.336 — ABNORMAL LOW (ref 7.350–7.450)
pO2, Arterial: 92 mmHg (ref 83.0–108.0)

## 2020-07-20 LAB — GLUCOSE, CAPILLARY
Glucose-Capillary: 98 mg/dL (ref 70–99)
Glucose-Capillary: 94 mg/dL (ref 70–99)

## 2020-07-20 SURGERY — RIGHT/LEFT HEART CATH AND CORONARY ANGIOGRAPHY
Anesthesia: LOCAL

## 2020-07-20 MED ORDER — LIDOCAINE HCL (PF) 1 % IJ SOLN
INTRAMUSCULAR | Status: AC
  Filled 2020-07-20: qty 30

## 2020-07-20 MED ORDER — LIDOCAINE HCL (PF) 1 % IJ SOLN
INTRAMUSCULAR | Status: DC | PRN
  Administered 2020-07-20 (×2): 2 mL via SUBCUTANEOUS

## 2020-07-20 MED ORDER — ACETAMINOPHEN 325 MG PO TABS: 650.0000 mg | ORAL_TABLET | ORAL | Status: DC | PRN

## 2020-07-20 MED ORDER — FENTANYL CITRATE (PF) 100 MCG/2ML IJ SOLN
INTRAMUSCULAR | Status: DC | PRN
Start: 2020-07-20 — End: 2020-07-20
  Administered 2020-07-20: 50 ug via INTRAVENOUS
  Administered 2020-07-20: 25 ug via INTRAVENOUS

## 2020-07-20 MED ORDER — SODIUM CHLORIDE 0.9% FLUSH: 3.0000 mL | Freq: Two times a day (BID) | INTRAVENOUS | Status: DC

## 2020-07-20 MED ORDER — SODIUM CHLORIDE 0.9 % WEIGHT BASED INFUSION
3.0000 mL/kg/h | INTRAVENOUS | Status: AC
  Administered 2020-07-20: 250 mL via INTRAVENOUS
  Administered 2020-07-20: 3 mL/kg/h via INTRAVENOUS

## 2020-07-20 MED ORDER — IOHEXOL 350 MG/ML SOLN
INTRAVENOUS | Status: DC | PRN
  Administered 2020-07-20: 60 mL via INTRA_ARTERIAL

## 2020-07-20 MED ORDER — SODIUM CHLORIDE 0.9% FLUSH: 3.0000 mL | INTRAVENOUS | Status: DC | PRN

## 2020-07-20 MED ORDER — MIDAZOLAM HCL 2 MG/2ML IJ SOLN
INTRAMUSCULAR | Status: AC
  Filled 2020-07-20: qty 2

## 2020-07-20 MED ORDER — ASPIRIN 81 MG PO CHEW: 81.0000 mg | CHEWABLE_TABLET | ORAL | Status: DC

## 2020-07-20 MED ORDER — SODIUM CHLORIDE 0.9 % WEIGHT BASED INFUSION: 1.0000 mL/kg/h | INTRAVENOUS | Status: AC

## 2020-07-20 MED ORDER — ONDANSETRON HCL 4 MG/2ML IJ SOLN: 4.0000 mg | Freq: Four times a day (QID) | INTRAMUSCULAR | Status: DC | PRN

## 2020-07-20 MED ORDER — FENTANYL CITRATE (PF) 100 MCG/2ML IJ SOLN
INTRAMUSCULAR | Status: AC
  Filled 2020-07-20: qty 2

## 2020-07-20 MED ORDER — SODIUM CHLORIDE 0.9 % IV SOLN: 250.0000 mL | INTRAVENOUS | Status: DC | PRN

## 2020-07-20 MED ORDER — HEPARIN SODIUM (PORCINE) 1000 UNIT/ML IJ SOLN
INTRAMUSCULAR | Status: DC | PRN
  Administered 2020-07-20: 4000 [IU] via INTRAVENOUS

## 2020-07-20 MED ORDER — SODIUM CHLORIDE 0.9 % WEIGHT BASED INFUSION: 1.0000 mL/kg/h | INTRAVENOUS | Status: DC

## 2020-07-20 MED ORDER — VERAPAMIL HCL 2.5 MG/ML IV SOLN
INTRAVENOUS | Status: DC | PRN
  Administered 2020-07-20: 10 mL via INTRA_ARTERIAL

## 2020-07-20 MED ORDER — HEPARIN SODIUM (PORCINE) 1000 UNIT/ML IJ SOLN
INTRAMUSCULAR | Status: AC
  Filled 2020-07-20: qty 1

## 2020-07-20 MED ORDER — MIDAZOLAM HCL 2 MG/2ML IJ SOLN
INTRAMUSCULAR | Status: DC | PRN
  Administered 2020-07-20 (×2): 1 mg via INTRAVENOUS

## 2020-07-20 MED ORDER — HEPARIN (PORCINE) IN NACL 1000-0.9 UT/500ML-% IV SOLN
INTRAVENOUS | Status: DC | PRN
  Administered 2020-07-20 (×2): 500 mL

## 2020-07-20 MED ORDER — HEPARIN (PORCINE) IN NACL 1000-0.9 UT/500ML-% IV SOLN
INTRAVENOUS | Status: AC
  Filled 2020-07-20: qty 1000

## 2020-07-20 MED ORDER — VERAPAMIL HCL 2.5 MG/ML IV SOLN
INTRAVENOUS | Status: AC
  Filled 2020-07-20: qty 2

## 2020-07-20 SURGICAL SUPPLY — 15 items
CATH BALLN WEDGE 5F 110CM (CATHETERS) ×2 IMPLANT
CATH INFINITI 4FR 145 PIGTAIL (CATHETERS) ×2 IMPLANT
CATH INFINITI 5FR AL1 (CATHETERS) ×2 IMPLANT
CATH OPTITORQUE TIG 4.0 5F (CATHETERS) ×2 IMPLANT
DEVICE RAD COMP TR BAND LRG (VASCULAR PRODUCTS) ×2 IMPLANT
GLIDESHEATH SLEND A-KIT 6F 22G (SHEATH) ×2 IMPLANT
GUIDEWIRE INQWIRE 1.5J.035X260 (WIRE) ×1 IMPLANT
INQWIRE 1.5J .035X260CM (WIRE) ×2
KIT HEART LEFT (KITS) ×2 IMPLANT
PACK CARDIAC CATHETERIZATION (CUSTOM PROCEDURE TRAY) ×2 IMPLANT
SHEATH GLIDE SLENDER 4/5FR (SHEATH) ×2 IMPLANT
SHEATH PROBE COVER 6X72 (BAG) ×2 IMPLANT
TRANSDUCER W/STOPCOCK (MISCELLANEOUS) ×2 IMPLANT
TUBING CIL FLEX 10 FLL-RA (TUBING) ×2 IMPLANT
WIRE HI TORQ VERSACORE-J 145CM (WIRE) ×2 IMPLANT

## 2020-07-20 NOTE — Research (Signed)
[place] Informed Consent   Subject Name: Mary Reed  Subject met inclusion and exclusion criteria.  The informed consent form, study requirements and expectations were reviewed with the subject and questions and concerns were addressed prior to the signing of the consent form.  The subject verbalized understanding of the trial requirements.  The subject agreed to participate in the [facility] trial and signed the informed consent at 1155 on 07/20/20.  The informed consent was obtained prior to performance of any protocol-specific procedures for the subject.  A copy of the signed informed consent was given to the subject and a copy was placed in the subject's medical record.   Daquawn Seelman

## 2020-07-20 NOTE — Discharge Instructions (Signed)
DRINK PLENTY OF FLUIDS OVER THE NEXT 2-3 DAYS. Radial Site Care  This sheet gives you information about how to care for yourself after your procedure. Your health care provider may also give you more specific instructions. If you have problems or questions, contact your health care provider. What can I expect after the procedure? After the procedure, it is common to have:  Bruising and tenderness at the catheter insertion area. Follow these instructions at home: Medicines  Take over-the-counter and prescription medicines only as told by your health care provider. Insertion site care  Follow instructions from your health care provider about how to take care of your insertion site. Make sure you: ? Wash your hands with soap and water before you change your bandage (dressing). If soap and water are not available, use hand sanitizer. ? Change your dressing as told by your health care provider. ? Leave stitches (sutures), skin glue, or adhesive strips in place. These skin closures may need to stay in place for 2 weeks or longer. If adhesive strip edges start to loosen and curl up, you may trim the loose edges. Do not remove adhesive strips completely unless your health care provider tells you to do that.  Check your insertion site every day for signs of infection. Check for: ? Redness, swelling, or pain. ? Fluid or blood. ? Pus or a bad smell. ? Warmth.  Do not take baths, swim, or use a hot tub until your health care provider approves.  You may shower 24-48 hours after the procedure, or as directed by your health care provider. ? Remove the dressing and gently wash the site with plain soap and water. ? Pat the area dry with a clean towel. ? Do not rub the site. That could cause bleeding.  Do not apply powder or lotion to the site. Activity   For 24 hours after the procedure, or as directed by your health care provider: ? Do not flex or bend the affected arm. ? Do not push or pull  heavy objects with the affected arm. ? Do not drive yourself home from the hospital or clinic. You may drive 24 hours after the procedure unless your health care provider tells you not to. ? Do not operate machinery or power tools.  Do not lift anything that is heavier than 10 lb (4.5 kg), or the limit that you are told, until your health care provider says that it is safe.  Ask your health care provider when it is okay to: ? Return to work or school. ? Resume usual physical activities or sports. ? Resume sexual activity. General instructions  If the catheter site starts to bleed, raise your arm and put firm pressure on the site. If the bleeding does not stop, get help right away. This is a medical emergency.  If you went home on the same day as your procedure, a responsible adult should be with you for the first 24 hours after you arrive home.  Keep all follow-up visits as told by your health care provider. This is important. Contact a health care provider if:  You have a fever.  You have redness, swelling, or yellow drainage around your insertion site. Get help right away if:  You have unusual pain at the radial site.  The catheter insertion area swells very fast.  The insertion area is bleeding, and the bleeding does not stop when you hold steady pressure on the area.  Your arm or hand becomes pale, cool, tingly,   or numb. These symptoms may represent a serious problem that is an emergency. Do not wait to see if the symptoms will go away. Get medical help right away. Call your local emergency services (911 in the [place] Do not drive yourself to the hospital. Summary  After the procedure, it is common to have bruising and tenderness at the site.  Follow instructions from your health care provider about how to take care of your radial site wound. Check the wound every day for signs of infection.  Do not lift anything that is heavier than 10 lb (4.5 kg), or the limit that you are  told, until your health care provider says that it is safe. This information is not intended to replace advice given to you by your health care provider. Make sure you discuss any questions you have with your health care provider. Document Revised: 12/12/2017 Document Reviewed: 12/12/2017 Elsevier Patient Education  2020 [employer]  

## 2020-07-20 NOTE — Interval H&P Note (Signed)
History and Physical Interval Note:  07/20/2020 1:15 PM  [name]  has presented today for surgery, with the diagnosis of doe.  The various methods of treatment have been discussed with the patient and family. After consideration of risks, benefits and other options for treatment, the patient has consented to  Procedure(s): RIGHT/LEFT HEART CATH AND CORONARY ANGIOGRAPHY (N/A) as a surgical intervention.  The patient's history has been reviewed, patient examined, no change in status, stable for surgery.  I have reviewed the patient's chart and labs.  Questions were answered to the patient's satisfaction.     Adrian Prows

## 2020-07-21 ENCOUNTER — Encounter (HOSPITAL_COMMUNITY): Payer: Self-pay | Admitting: Cardiology

## 2020-07-21 DIAGNOSIS — R002 Palpitations: Secondary | ICD-10-CM | POA: Diagnosis not present

## 2020-07-28 DIAGNOSIS — D509 Iron deficiency anemia, unspecified: Secondary | ICD-10-CM | POA: Diagnosis not present

## 2020-07-28 DIAGNOSIS — E559 Vitamin D deficiency, unspecified: Secondary | ICD-10-CM | POA: Diagnosis not present

## 2020-07-28 DIAGNOSIS — E7800 Pure hypercholesterolemia, unspecified: Secondary | ICD-10-CM | POA: Diagnosis not present

## 2020-07-28 DIAGNOSIS — E119 Type 2 diabetes mellitus without complications: Secondary | ICD-10-CM | POA: Diagnosis not present

## 2020-07-28 DIAGNOSIS — I10 Essential (primary) hypertension: Secondary | ICD-10-CM | POA: Diagnosis not present

## 2020-07-29 DIAGNOSIS — R002 Palpitations: Secondary | ICD-10-CM | POA: Diagnosis not present

## 2020-08-02 DIAGNOSIS — Z1231 Encounter for screening mammogram for malignant neoplasm of breast: Secondary | ICD-10-CM | POA: Diagnosis not present

## 2020-08-09 ENCOUNTER — Ambulatory Visit: Payer: Medicare HMO | Admitting: Cardiology

## 2020-08-09 ENCOUNTER — Other Ambulatory Visit: Payer: Self-pay

## 2020-08-09 ENCOUNTER — Encounter: Payer: Self-pay | Admitting: Cardiology

## 2020-08-09 VITALS — BP 140/74 | HR 69 | Resp 16 | Ht 69.0 in | Wt 182.0 lb

## 2020-08-09 DIAGNOSIS — G43909 Migraine, unspecified, not intractable, without status migrainosus: Secondary | ICD-10-CM | POA: Diagnosis not present

## 2020-08-09 DIAGNOSIS — I350 Nonrheumatic aortic (valve) stenosis: Secondary | ICD-10-CM | POA: Diagnosis not present

## 2020-08-09 DIAGNOSIS — R0609 Other forms of dyspnea: Secondary | ICD-10-CM

## 2020-08-09 DIAGNOSIS — I10 Essential (primary) hypertension: Secondary | ICD-10-CM | POA: Diagnosis not present

## 2020-08-09 DIAGNOSIS — H43813 Vitreous degeneration, bilateral: Secondary | ICD-10-CM | POA: Diagnosis not present

## 2020-08-09 DIAGNOSIS — R0600 Dyspnea, unspecified: Secondary | ICD-10-CM

## 2020-08-09 DIAGNOSIS — H25813 Combined forms of age-related cataract, bilateral: Secondary | ICD-10-CM | POA: Diagnosis not present

## 2020-08-09 DIAGNOSIS — I6523 Occlusion and stenosis of bilateral carotid arteries: Secondary | ICD-10-CM

## 2020-08-09 MED ORDER — ATENOLOL 50 MG PO TABS: 50.0000 mg | ORAL_TABLET | Freq: Every day | ORAL | 3 refills | Status: DC
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
## 2020-08-12 ENCOUNTER — Other Ambulatory Visit: Payer: Self-pay

## 2020-08-12 DIAGNOSIS — I350 Nonrheumatic aortic (valve) stenosis: Secondary | ICD-10-CM

## 2020-08-13 DIAGNOSIS — I359 Nonrheumatic aortic valve disorder, unspecified: Secondary | ICD-10-CM

## 2020-08-13 DIAGNOSIS — Z952 Presence of prosthetic heart valve: Secondary | ICD-10-CM

## 2020-08-17 ENCOUNTER — Encounter: Payer: Self-pay | Admitting: Physician Assistant

## 2020-08-17 ENCOUNTER — Ambulatory Visit (HOSPITAL_COMMUNITY)
Admission: RE | Admit: 2020-08-17 | Discharge: 2020-08-17 | Disposition: A | Discharge status: Home / Self Care | Payer: Medicare HMO | Source: Ambulatory Visit | Attending: Surgery | Admitting: Surgery

## 2020-08-17 ENCOUNTER — Other Ambulatory Visit: Payer: Self-pay

## 2020-08-17 DIAGNOSIS — I350 Nonrheumatic aortic (valve) stenosis: Secondary | ICD-10-CM

## 2020-08-17 DIAGNOSIS — Z01818 Encounter for other preprocedural examination: Secondary | ICD-10-CM | POA: Diagnosis not present

## 2020-08-17 MED ORDER — IOHEXOL 350 MG/ML SOLN
100.0000 mL | Freq: Once | INTRAVENOUS | Status: AC | PRN
  Administered 2020-08-17: 100 mL via INTRAVENOUS

## 2020-08-18 ENCOUNTER — Other Ambulatory Visit: Payer: Self-pay

## 2020-08-18 ENCOUNTER — Telehealth: Payer: Self-pay

## 2020-08-18 ENCOUNTER — Encounter: Payer: Self-pay | Admitting: Obstetrics and Gynecology

## 2020-08-18 ENCOUNTER — Institutional Professional Consult (permissible substitution): Payer: Medicare HMO | Admitting: Surgery

## 2020-08-18 ENCOUNTER — Other Ambulatory Visit: Payer: Self-pay | Admitting: Physician Assistant

## 2020-08-18 VITALS — BP 180/90 | HR 65 | Temp 97.8°F | Resp 20 | Ht 69.0 in | Wt 182.0 lb

## 2020-08-18 DIAGNOSIS — I350 Nonrheumatic aortic (valve) stenosis: Secondary | ICD-10-CM | POA: Diagnosis not present

## 2020-08-18 DIAGNOSIS — K862 Cyst of pancreas: Secondary | ICD-10-CM

## 2020-08-18 NOTE — Progress Notes (Signed)
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 not be able to repeat TAVR if a transcatheter valve suffered structural deterioration.  She understands all this and would like to proceed with TAVR if possible.  Her gated cardiac CTA shows anatomy suitable for transcatheter aortic valve  placement using a SAPIEN 3 valve.  Her abdominal and pelvic CTA shows adequate pelvic vascular anatomy to allow transfemoral insertion.  Her abdominal CT scan also shows a complex 1.7 cm cystic multilocular pancreatic lesion in the uncinate process of unclear significance.  There is no dilatation of the main  pancreatic duct.  Radiology is recommended doing an MRA of the abdomen with and without contrast to further evaluate this.  The patient was counseled at length regarding treatment alternatives for management of severe symptomatic aortic stenosis. The risks and benefits of surgical intervention has been discussed in detail. Long-term prognosis with medical therapy was discussed. Alternative approaches such as conventional surgical aortic valve replacement, transcatheter aortic valve replacement, and palliative medical therapy were compared and contrasted at length. This discussion was placed in the context of the patient's own specific clinical presentation and past medical history. All of her questions have been addressed.   Following the decision to proceed with transcatheter aortic valve replacement, a discussion was held regarding what types of management strategies would be attempted intraoperatively in the event of life-threatening complications, including whether or not the patient would be considered a candidate for the use of cardiopulmonary bypass and/or conversion to open sternotomy for attempted surgical intervention. The patient is aware of the fact that transient use of cardiopulmonary bypass may be necessary.  She is a low risk surgical patient and would be considered a candidate for emergent sternotomy to manage any intraoperative complications.  The patient has been advised of a variety of complications that might develop including but not limited to risks of death, stroke, paravalvular leak, aortic dissection or other major vascular complications, aortic annulus rupture, device embolization, cardiac rupture or perforation, mitral regurgitation, acute myocardial infarction, arrhythmia, heart block or bradycardia requiring permanent pacemaker placement, congestive heart failure, respiratory failure, renal failure, pneumonia, infection, other late complications related to structural valve  deterioration or migration, or other complications that might ultimately cause a temporary or permanent loss of functional independence or other long term morbidity. The patient provides full informed consent for the procedure as described and all questions were answered.     Plan:  She will be scheduled for an MRI of the abdomen with and without contrast to further evaluate the pancreatic lesion.  She will be discussed at our multidisciplinary heart valve team meeting and if she is felt to be a suitable candidate for TAVR we will schedule transfemoral transcatheter aortic valve replacement in the near future.  I spent 60 minutes performing this consultation and > 50% of this time was spent face to face counseling and coordinating the care of this patient's severe symptomatic aortic stenosis.  Gaye Pollack, MD 08/18/2020

## 2020-08-18 NOTE — Telephone Encounter (Signed)
Per Dr. Cyndia Bent and Nell Range, open abd MRI ordered for patient.  "4. 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 No biliary or main pancreatic duct dilation. Recommend further evaluation with MRI abdomen without and with IV contrast at this time."  Faxed order to [facility] ([phone]) as they are the only ones in [place] with open machine. Confirmed they will call to make appointment.

## 2020-08-18 NOTE — Telephone Encounter (Deleted)
-----   Message from Eileen Stanford, [place] sent at 08/18/2020 11:23 AM EDT ----- Regarding: MRI [facility] wants to get an OPEN MRI (pt extremely claustrophobic) for the pancreas lesion on pre TAVR CT. She is an absolute doll ( the pt) Thank  you!!!!!!!

## 2020-08-19 ENCOUNTER — Encounter: Payer: Self-pay | Admitting: Surgery

## 2020-08-21 DIAGNOSIS — R69 Illness, unspecified: Secondary | ICD-10-CM | POA: Diagnosis not present

## 2020-08-24 ENCOUNTER — Other Ambulatory Visit: Payer: Self-pay | Admitting: Cardiothoracic Surgery

## 2020-08-24 DIAGNOSIS — Z951 Presence of aortocoronary bypass graft: Secondary | ICD-10-CM

## 2020-08-25 ENCOUNTER — Encounter: Payer: Medicare HMO | Admitting: Surgery

## 2020-08-25 ENCOUNTER — Ambulatory Visit: Payer: Medicare HMO | Admitting: Obstetrics and Gynecology

## 2020-08-25 DIAGNOSIS — K862 Cyst of pancreas: Secondary | ICD-10-CM | POA: Diagnosis not present

## 2020-08-25 NOTE — Progress Notes (Signed)
MRI to follow pancreatic lesion.

## 2020-08-25 NOTE — Telephone Encounter (Signed)
Results imported into Epic to [employer] this afternoon.

## 2020-08-30 ENCOUNTER — Other Ambulatory Visit: Payer: Self-pay | Admitting: Physician Assistant

## 2020-08-30 ENCOUNTER — Encounter: Payer: Self-pay | Admitting: Physician Assistant

## 2020-08-30 DIAGNOSIS — I350 Nonrheumatic aortic (valve) stenosis: Secondary | ICD-10-CM

## 2020-08-31 ENCOUNTER — Other Ambulatory Visit: Payer: Self-pay

## 2020-08-31 DIAGNOSIS — I350 Nonrheumatic aortic (valve) stenosis: Secondary | ICD-10-CM

## 2020-09-02 ENCOUNTER — Encounter (HOSPITAL_COMMUNITY): Payer: Self-pay

## 2020-09-02 NOTE — Progress Notes (Signed)
[address] [place], [place] [facility] [address] [place] [address] Phone: [phone] Fax: [phone]      Your procedure is scheduled on Tuesday, October 19th.  Report to [facility] Main Entrance "A" at 8:45 A.M., and check in at the Admitting office.  Call this number if you have problems the morning of surgery:  [phone]  Call [phone] if you have any questions prior to your surgery date Monday-Friday 8am-4pm    Remember:  Do not eat or drink after midnight the night before your surgery     Take these medicines the morning of surgery with A SIP OF WATER   NONE  As of today, STOP taking any Aspirin (unless otherwise instructed by your surgeon) Aleve, Naproxen, Ibuprofen, Motrin, Advil, Goody's, BC's, all herbal medications, fish oil, and all vitamins.   WHAT DO I DO ABOUT MY DIABETES MEDICATION?   Marland Kitchen Do not take diabetes medicines the morning of surgery.   HOW TO MANAGE YOUR DIABETES BEFORE AND AFTER SURGERY  Why is it important to control my blood sugar before and after surgery? . Improving blood sugar levels before and after surgery helps healing and can limit problems. . A way of improving blood sugar control is eating a healthy diet by: o  Eating less sugar and carbohydrates o  Increasing activity/exercise o  Talking with your doctor about reaching your blood sugar goals . High blood sugars (greater than 180 mg/dL) can raise your risk of infections and slow your recovery, so you will need to focus on controlling your diabetes during the weeks before surgery. . Make sure that the doctor who takes care of your diabetes knows about your planned surgery including the date and location.  How do I manage my blood sugar before surgery? . Check your blood sugar at least 4 times a day, starting 2 days before surgery, to make sure that the level is not too high or low. . Check your blood sugar the morning of your surgery when you  wake up and every 2 hours until you get to the Short Stay unit. o If your blood sugar is less than 70 mg/dL, you will need to treat for low blood sugar: - Do not take insulin. - Treat a low blood sugar (less than 70 mg/dL) with  cup of clear juice (cranberry or apple), 4 glucose tablets, OR glucose gel. - Recheck blood sugar in 15 minutes after treatment (to make sure it is greater than 70 mg/dL). If your blood sugar is not greater than 70 mg/dL on recheck, call [phone] for further instructions. . Report your blood sugar to the short stay nurse when you get to Short Stay.  . If you are admitted to the hospital after surgery: o Your blood sugar will be checked by the staff and you will probably be given insulin after surgery (instead of oral diabetes medicines) to make sure you have good blood sugar levels. o The goal for blood sugar control after surgery is 80-180 mg/dL.              DAY OF SURGERY:                    Do not wear jewelry, make up, or nail polish            Do not wear lotions, powders, perfumes, or deodorant.            Do not shave 48 hours  prior to surgery.              Do not bring valuables to the hospital.            [facility] is not responsible for any belongings or valuables.  Do NOT Smoke (Tobacco/Vaping) or drink Alcohol 24 hours prior to your procedure If you use a CPAP at night, you may bring all equipment for your overnight stay.   Contacts, glasses, dentures or bridgework may not be worn into surgery.      For patients admitted to the hospital, discharge time will be determined by your treatment team.   Patients discharged the day of surgery will not be allowed to drive home, and someone needs to stay with them for 24 hours.    Special instructions:   [place]- Preparing For Surgery  Before surgery, you can play an important role. Because skin is not sterile, your skin needs to be as free of germs as possible. You can reduce the number of  germs on your skin by washing with CHG (chlorahexidine gluconate) Soap before surgery.  CHG is an antiseptic cleaner which kills germs and bonds with the skin to continue killing germs even after washing.    Oral Hygiene is also important to reduce your risk of infection.  Remember - BRUSH YOUR TEETH THE MORNING OF SURGERY WITH YOUR REGULAR TOOTHPASTE  Please do not use if you have an allergy to CHG or antibacterial soaps. If your skin becomes reddened/irritated stop using the CHG.  Do not shave (including legs and underarms) for at least 48 hours prior to first CHG shower. It is OK to shave your face.  Please follow these instructions carefully.   1. Shower the NIGHT BEFORE SURGERY and the MORNING OF SURGERY with CHG Soap.   2. If you chose to wash your hair, wash your hair first as usual with your normal shampoo.  3. After you shampoo, rinse your hair and body thoroughly to remove the shampoo.  4. Use CHG as you would any other liquid soap. You can apply CHG directly to the skin and wash gently with a scrungie or a clean washcloth.   5. Apply the CHG Soap to your body ONLY FROM THE NECK DOWN.  Do not use on open wounds or open sores. Avoid contact with your eyes, ears, mouth and genitals (private parts). Wash Face and genitals (private parts)  with your normal soap.   6. Wash thoroughly, paying special attention to the area where your surgery will be performed.  7. Thoroughly rinse your body with warm water from the neck down.  8. DO NOT shower/wash with your normal soap after using and rinsing off the CHG Soap.  9. Pat yourself dry with a CLEAN TOWEL.  10. Wear CLEAN PAJAMAS to bed the night before surgery  11. Place CLEAN SHEETS on your bed the night of your first shower and DO NOT SLEEP WITH PETS.   Day of Surgery: Wear Clean/Comfortable clothing the morning of surgery Do not apply any deodorants/lotions.   Remember to brush your teeth WITH YOUR REGULAR TOOTHPASTE.   Please  read over the following fact sheets that you were given.

## 2020-09-03 ENCOUNTER — Ambulatory Visit (HOSPITAL_COMMUNITY)
Admission: RE | Admit: 2020-09-03 | Discharge: 2020-09-03 | Disposition: A | Discharge status: Home / Self Care | Payer: Medicare HMO | Source: Ambulatory Visit | Attending: Physician Assistant | Admitting: Physician Assistant

## 2020-09-03 ENCOUNTER — Other Ambulatory Visit: Payer: Self-pay

## 2020-09-03 ENCOUNTER — Other Ambulatory Visit (HOSPITAL_COMMUNITY): Payer: Medicare HMO

## 2020-09-03 ENCOUNTER — Encounter (HOSPITAL_COMMUNITY): Payer: Self-pay

## 2020-09-03 ENCOUNTER — Encounter: Payer: Self-pay | Admitting: Physical Therapy

## 2020-09-03 ENCOUNTER — Encounter (HOSPITAL_COMMUNITY)
Admission: RE | Admit: 2020-09-03 | Discharge: 2020-09-03 | Disposition: A | Discharge status: Home / Self Care | Payer: Medicare HMO | Source: Ambulatory Visit | Attending: Cardiovascular Disease | Admitting: Cardiovascular Disease

## 2020-09-03 ENCOUNTER — Ambulatory Visit: Payer: Medicare HMO | Attending: Physician Assistant | Admitting: Physical Therapy

## 2020-09-03 DIAGNOSIS — Z01818 Encounter for other preprocedural examination: Secondary | ICD-10-CM | POA: Insufficient documentation

## 2020-09-03 DIAGNOSIS — I350 Nonrheumatic aortic (valve) stenosis: Secondary | ICD-10-CM | POA: Diagnosis not present

## 2020-09-03 DIAGNOSIS — R262 Difficulty in walking, not elsewhere classified: Secondary | ICD-10-CM | POA: Insufficient documentation

## 2020-09-03 LAB — URINALYSIS, ROUTINE W REFLEX MICROSCOPIC
Bilirubin Urine: NEGATIVE
Glucose, UA: NEGATIVE mg/dL
Hgb urine dipstick: NEGATIVE
Ketones, ur: NEGATIVE mg/dL
Leukocytes,Ua: NEGATIVE
Nitrite: NEGATIVE
Protein, ur: NEGATIVE mg/dL
Specific Gravity, Urine: 1.005 (ref 1.005–1.030)
pH: 6 (ref 5.0–8.0)

## 2020-09-03 LAB — BLOOD GAS, ARTERIAL
Acid-base deficit: 0.7 mmol/L (ref 0.0–2.0)
Bicarbonate: 23.2 mmol/L (ref 20.0–28.0)
FIO2: 21
O2 Saturation: 97.3 %
Patient temperature: 37
pCO2 arterial: 36.7 mmHg (ref 32.0–48.0)
pH, Arterial: 7.417 (ref 7.350–7.450)
pO2, Arterial: 95.1 mmHg (ref 83.0–108.0)

## 2020-09-03 LAB — TYPE AND SCREEN
ABO/RH(D): O POS
Antibody Screen: NEGATIVE

## 2020-09-03 LAB — COMPREHENSIVE METABOLIC PANEL
ALT: 15 U/L (ref 0–44)
AST: 16 U/L (ref 15–41)
Albumin: 3.8 g/dL (ref 3.5–5.0)
Alkaline Phosphatase: 84 U/L (ref 38–126)
Anion gap: 10 (ref 5–15)
BUN: 7 mg/dL — ABNORMAL LOW (ref 8–23)
CO2: 21 mmol/L — ABNORMAL LOW (ref 22–32)
Calcium: 8.6 mg/dL — ABNORMAL LOW (ref 8.9–10.3)
Chloride: 107 mmol/L (ref 98–111)
Creatinine, Ser: 0.64 mg/dL (ref 0.44–1.00)
GFR, Estimated: >60 mL/min (ref >60)
Glucose, Bld: 108 mg/dL — ABNORMAL HIGH (ref 70–99)
Potassium: 3.6 mmol/L (ref 3.5–5.1)
Sodium: 138 mmol/L (ref 135–145)
Total Bilirubin: 0.7 mg/dL (ref 0.3–1.2)
Total Protein: 7 g/dL (ref 6.5–8.1)

## 2020-09-03 LAB — CBC
HCT: 37.7 % (ref 36.0–46.0)
Hemoglobin: 12.3 g/dL (ref 12.0–15.0)
MCH: 28 pg (ref 26.0–34.0)
MCHC: 32.6 g/dL (ref 30.0–36.0)
MCV: 85.7 fL (ref 80.0–100.0)
Platelets: 182 10*3/uL (ref 150–400)
RBC: 4.4 MIL/uL (ref 3.87–5.11)
RDW: 13.3 % (ref 11.5–15.5)
WBC: 6.7 10*3/uL (ref 4.0–10.5)
nRBC: 0 % (ref 0.0–0.2)

## 2020-09-03 LAB — PROTIME-INR
INR: 1 (ref 0.8–1.2)
Prothrombin Time: 12.9 seconds (ref 11.4–15.2)

## 2020-09-03 LAB — GLUCOSE, CAPILLARY: Glucose-Capillary: 136 mg/dL — ABNORMAL HIGH (ref 70–99)

## 2020-09-03 LAB — SURGICAL PCR SCREEN
MRSA, PCR: NEGATIVE
Staphylococcus aureus: POSITIVE — AB

## 2020-09-03 LAB — BRAIN NATRIURETIC PEPTIDE: B Natriuretic Peptide: 287 pg/mL — ABNORMAL HIGH (ref 0.0–100.0)

## 2020-09-03 LAB — APTT: aPTT: 32 seconds (ref 24–36)
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
## 2020-09-03 NOTE — Progress Notes (Signed)
PCP - Deland Pretty Cardiologist - Dr. Einar Gip  Chest x-ray - 09-03-20 EKG - 09-03-20 ECHO - 08-30-20 Cardiac Cath - 07-20-20  DM - Type 2  Fasting Blood Sugar - 230   COVID TEST- Saturday 09-04-20   Anesthesia review: yes, heart history  Patient denies shortness of breath, fever, cough and chest pain at PAT appointment   All instructions explained to the patient, with a verbal understanding of the material. Patient agrees to go over the instructions while at home for a better understanding. Patient also instructed to self quarantine after being tested for COVID-19. The opportunity to ask questions was provided.

## 2020-09-04 ENCOUNTER — Other Ambulatory Visit (HOSPITAL_COMMUNITY)
Admission: RE | Admit: 2020-09-04 | Discharge: 2020-09-04 | Disposition: A | Discharge status: Home / Self Care | Payer: Medicare HMO | Source: Ambulatory Visit | Attending: Surgery | Admitting: Surgery

## 2020-09-04 ENCOUNTER — Telehealth: Payer: Self-pay | Admitting: Cardiology

## 2020-09-04 DIAGNOSIS — Z01812 Encounter for preprocedural laboratory examination: Secondary | ICD-10-CM | POA: Insufficient documentation

## 2020-09-04 DIAGNOSIS — Z20822 Contact with and (suspected) exposure to covid-19: Secondary | ICD-10-CM | POA: Insufficient documentation

## 2020-09-04 LAB — HEMOGLOBIN A1C
Hgb A1c MFr Bld: 6 % — ABNORMAL HIGH (ref 4.8–5.6)
Mean Plasma Glucose: 126 mg/dL

## 2020-09-04 NOTE — Telephone Encounter (Signed)
-----   Message from Eileen Stanford, PA-C sent at 09/03/2020  1:54 PM EDT ----- Regarding: 1 month follow up Hey! She is scheduled for TAVR on 10/19.  I got her transition of care visit with me the following week. Do you want to do her echo and 1 month. follow up? It will just need to be at least 23 days after her TAVR. Let me know. Thanks! KT

## 2020-09-04 NOTE — Telephone Encounter (Signed)
ICD-10-CM   1. S/P TAVR (transcatheter aortic valve replacement)  Z95.2 PCV ECHOCARDIOGRAM COMPLETE  2. Aortic valve disorder  I35.9 PCV ECHOCARDIOGRAM COMPLETE     Post TAVR echo baseline ordered.   Adrian Prows, MD, [facility] 09/04/2020, 8:39 AM Office: [phone]

## 2020-09-05 LAB — SARS CORONAVIRUS 2 (TAT 6-24 HRS): SARS Coronavirus 2: NEGATIVE

## 2020-09-06 MED ORDER — SODIUM CHLORIDE 0.9 % IV SOLN
INTRAVENOUS | Status: DC
  Filled 2020-09-06: qty 30

## 2020-09-06 MED ORDER — MAGNESIUM SULFATE 50 % IJ SOLN
40.0000 meq | INTRAMUSCULAR | Status: DC
  Filled 2020-09-06: qty 9.85

## 2020-09-06 MED ORDER — SODIUM CHLORIDE 0.9 % IV SOLN
1.5000 g | INTRAVENOUS | Status: AC
  Administered 2020-09-07: 1.5 g via INTRAVENOUS
  Filled 2020-09-06: qty 1.5

## 2020-09-06 MED ORDER — POTASSIUM CHLORIDE 2 MEQ/ML IV SOLN
80.0000 meq | INTRAVENOUS | Status: DC
  Filled 2020-09-06: qty 40

## 2020-09-06 MED ORDER — DEXMEDETOMIDINE HCL IN NACL 400 MCG/100ML IV SOLN
0.1000 ug/kg/h | INTRAVENOUS | Status: AC
  Administered 2020-09-07: .4 ug/kg/h via INTRAVENOUS
  Filled 2020-09-06: qty 100

## 2020-09-06 MED ORDER — VANCOMYCIN HCL 1250 MG/250ML IV SOLN
1250.0000 mg | INTRAVENOUS | Status: AC
  Administered 2020-09-07: 1250 mg via INTRAVENOUS
  Filled 2020-09-06: qty 250

## 2020-09-06 MED ORDER — NOREPINEPHRINE 4 MG/250ML-% IV SOLN
0.0000 ug/min | INTRAVENOUS | Status: AC
  Administered 2020-09-07: 1 ug/min via INTRAVENOUS
  Filled 2020-09-06: qty 250

## 2020-09-06 NOTE — H&P (Signed)
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 not be able to repeat TAVR if a transcatheter valve suffered structural deterioration. She understands all this and would like to proceed with  TAVR if possible. Her gated cardiac CTA shows anatomy suitable for transcatheter aortic valve placement using a SAPIEN 3 valve. Her abdominal and pelvic CTA shows adequate pelvic vascular anatomy to allow transfemoral insertion.   The patient was counseled at length regarding treatment alternatives for management of severe symptomatic aortic stenosis. The risks and benefits of surgical intervention has been discussed in detail. Long-term prognosis with medical therapy was discussed. Alternative approaches such as conventional surgical aortic valve replacement, transcatheter aortic valve replacement, and palliative medical therapy were compared and contrasted at length. This  discussion was placed in the context of the patient's own specific clinical presentation and past medical history. All of her questions have been addressed.  Following the decision to proceed with transcatheter aortic valve replacement, a discussion was held regarding what types of management strategies would be attempted intraoperatively in the event of life-threatening complications, including whether or not the patient would be considered a candidate for the use of cardiopulmonary bypass and/or conversion to open sternotomy for attempted surgical intervention. The patient is aware of the fact that transient use of cardiopulmonary bypass may be necessary. She is a low risk surgical patient and would be considered a candidate for emergent sternotomy to manage any intraoperative complications. The patient has been advised of a variety of complications that might develop including but not limited to risks of death, stroke, paravalvular leak, aortic dissection or other major vascular complications, aortic annulus rupture, device embolization, cardiac rupture or perforation, mitral regurgitation, acute myocardial infarction, arrhythmia, heart block or bradycardia requiring permanent pacemaker placement, congestive heart failure, respiratory failure, renal failure, pneumonia, infection, other late complications related to structural valve deterioration or migration, or other complications that might ultimately cause a temporary or permanent loss of functional independence or other long term morbidity. The patient provides full informed consent for the procedure as described and all questions were answered.   Plan:    Transfemoral transcatheter aortic valve replacement.   Gaye Pollack, MD

## 2020-09-07 ENCOUNTER — Inpatient Hospital Stay (HOSPITAL_COMMUNITY): Payer: Medicare HMO | Admitting: Physician Assistant

## 2020-09-07 ENCOUNTER — Inpatient Hospital Stay (HOSPITAL_COMMUNITY)
Admission: RE | Admit: 2020-09-07 | Discharge: 2020-09-08 | DRG: 267 | Disposition: A | Discharge status: Home / Self Care | Payer: Medicare HMO | Attending: Cardiovascular Disease | Admitting: Cardiovascular Disease

## 2020-09-07 ENCOUNTER — Inpatient Hospital Stay (HOSPITAL_COMMUNITY)
Admission: RE | Admit: 2020-09-07 | Discharge: 2020-09-07 | Disposition: A | Discharge status: Home / Self Care | Payer: Medicare HMO | Source: Ambulatory Visit | Attending: Physician Assistant | Admitting: Physician Assistant

## 2020-09-07 ENCOUNTER — Inpatient Hospital Stay (HOSPITAL_COMMUNITY): Payer: Medicare HMO | Admitting: Anesthesiology

## 2020-09-07 ENCOUNTER — Other Ambulatory Visit: Payer: Self-pay

## 2020-09-07 ENCOUNTER — Encounter (HOSPITAL_COMMUNITY): Payer: Self-pay | Admitting: Cardiovascular Disease

## 2020-09-07 ENCOUNTER — Encounter (HOSPITAL_COMMUNITY)
Admission: RE | Disposition: A | Discharge status: Home / Self Care | Payer: Medicare HMO | Source: Home / Self Care | Attending: Cardiovascular Disease

## 2020-09-07 DIAGNOSIS — Z952 Presence of prosthetic heart valve: Secondary | ICD-10-CM | POA: Diagnosis not present

## 2020-09-07 DIAGNOSIS — M545 Low back pain, unspecified: Secondary | ICD-10-CM | POA: Diagnosis not present

## 2020-09-07 DIAGNOSIS — Z85828 Personal history of other malignant neoplasm of skin: Secondary | ICD-10-CM | POA: Diagnosis not present

## 2020-09-07 DIAGNOSIS — I447 Left bundle-branch block, unspecified: Secondary | ICD-10-CM | POA: Diagnosis not present

## 2020-09-07 DIAGNOSIS — Z803 Family history of malignant neoplasm of breast: Secondary | ICD-10-CM

## 2020-09-07 DIAGNOSIS — Z823 Family history of stroke: Secondary | ICD-10-CM

## 2020-09-07 DIAGNOSIS — I110 Hypertensive heart disease with heart failure: Secondary | ICD-10-CM | POA: Diagnosis present

## 2020-09-07 DIAGNOSIS — Z87891 Personal history of nicotine dependence: Secondary | ICD-10-CM

## 2020-09-07 DIAGNOSIS — Z006 Encounter for examination for normal comparison and control in clinical research program: Secondary | ICD-10-CM

## 2020-09-07 DIAGNOSIS — Z79899 Other long term (current) drug therapy: Secondary | ICD-10-CM

## 2020-09-07 DIAGNOSIS — I350 Nonrheumatic aortic (valve) stenosis: Secondary | ICD-10-CM

## 2020-09-07 DIAGNOSIS — M1990 Unspecified osteoarthritis, unspecified site: Secondary | ICD-10-CM | POA: Diagnosis not present

## 2020-09-07 DIAGNOSIS — I10 Essential (primary) hypertension: Secondary | ICD-10-CM | POA: Diagnosis not present

## 2020-09-07 DIAGNOSIS — R519 Headache, unspecified: Secondary | ICD-10-CM | POA: Diagnosis not present

## 2020-09-07 DIAGNOSIS — E782 Mixed hyperlipidemia: Secondary | ICD-10-CM | POA: Diagnosis present

## 2020-09-07 DIAGNOSIS — Z20822 Contact with and (suspected) exposure to covid-19: Secondary | ICD-10-CM | POA: Diagnosis present

## 2020-09-07 DIAGNOSIS — I6523 Occlusion and stenosis of bilateral carotid arteries: Secondary | ICD-10-CM | POA: Diagnosis present

## 2020-09-07 DIAGNOSIS — I5032 Chronic diastolic (congestive) heart failure: Secondary | ICD-10-CM | POA: Diagnosis not present

## 2020-09-07 DIAGNOSIS — Z8249 Family history of ischemic heart disease and other diseases of the circulatory system: Secondary | ICD-10-CM | POA: Diagnosis not present

## 2020-09-07 DIAGNOSIS — R0602 Shortness of breath: Secondary | ICD-10-CM | POA: Diagnosis not present

## 2020-09-07 DIAGNOSIS — E119 Type 2 diabetes mellitus without complications: Secondary | ICD-10-CM | POA: Diagnosis not present

## 2020-09-07 DIAGNOSIS — I440 Atrioventricular block, first degree: Secondary | ICD-10-CM | POA: Diagnosis not present

## 2020-09-07 DIAGNOSIS — Z833 Family history of diabetes mellitus: Secondary | ICD-10-CM | POA: Diagnosis not present

## 2020-09-07 DIAGNOSIS — I080 Rheumatic disorders of both mitral and aortic valves: Secondary | ICD-10-CM | POA: Diagnosis not present

## 2020-09-07 DIAGNOSIS — K219 Gastro-esophageal reflux disease without esophagitis: Secondary | ICD-10-CM | POA: Diagnosis not present

## 2020-09-07 DIAGNOSIS — Z885 Allergy status to narcotic agent status: Secondary | ICD-10-CM

## 2020-09-07 HISTORY — PX: TEE WITHOUT CARDIOVERSION: SHX5443

## 2020-09-07 HISTORY — PX: TRANSCATHETER AORTIC VALVE REPLACEMENT, TRANSFEMORAL: SHX6400

## 2020-09-07 LAB — POCT I-STAT, CHEM 8
BUN: 7 mg/dL — ABNORMAL LOW (ref 8–23)
BUN: 7 mg/dL — ABNORMAL LOW (ref 8–23)
Calcium, Ion: 1.15 mmol/L (ref 1.15–1.40)
Calcium, Ion: 1.17 mmol/L (ref 1.15–1.40)
Chloride: 106 mmol/L (ref 98–111)
Chloride: 106 mmol/L (ref 98–111)
Creatinine, Ser: 0.5 mg/dL (ref 0.44–1.00)
Creatinine, Ser: 0.6 mg/dL (ref 0.44–1.00)
Glucose, Bld: 105 mg/dL — ABNORMAL HIGH (ref 70–99)
Glucose, Bld: 136 mg/dL — ABNORMAL HIGH (ref 70–99)
HCT: 36 % (ref 36.0–46.0)
HCT: 33 % — ABNORMAL LOW (ref 36.0–46.0)
Hemoglobin: 12.2 g/dL (ref 12.0–15.0)
Hemoglobin: 11.2 g/dL — ABNORMAL LOW (ref 12.0–15.0)
Potassium: 3.2 mmol/L — ABNORMAL LOW (ref 3.5–5.1)
Potassium: 3.1 mmol/L — ABNORMAL LOW (ref 3.5–5.1)
Sodium: 144 mmol/L (ref 135–145)
Sodium: 143 mmol/L (ref 135–145)
TCO2: 23 mmol/L (ref 22–32)
TCO2: 23 mmol/L (ref 22–32)

## 2020-09-07 LAB — ECHOCARDIOGRAM LIMITED
AR max vel: 1.67 cm2
AV Area VTI: 1.55 cm2
AV Area mean vel: 1.51 cm2
AV Mean grad: 45 mmHg
AV Peak grad: 37.8 mmHg
Ao pk vel: 3.07 m/s

## 2020-09-07 LAB — GLUCOSE, CAPILLARY: Glucose-Capillary: 120 mg/dL — ABNORMAL HIGH (ref 70–99)

## 2020-09-07 LAB — ABO/RH: ABO/RH(D): O POS

## 2020-09-07 SURGERY — IMPLANTATION, AORTIC VALVE, TRANSCATHETER, FEMORAL APPROACH
Anesthesia: Monitor Anesthesia Care | Site: Chest

## 2020-09-07 MED ORDER — IRBESARTAN 300 MG PO TABS
300.0000 mg | ORAL_TABLET | Freq: Every day | ORAL | Status: DC
  Administered 2020-09-08: 300 mg via ORAL
  Filled 2020-09-07: qty 1

## 2020-09-07 MED ORDER — ROSUVASTATIN CALCIUM 5 MG PO TABS
10.0000 mg | ORAL_TABLET | Freq: Every day | ORAL | Status: DC
  Filled 2020-09-07 (×2): qty 2

## 2020-09-07 MED ORDER — FENTANYL CITRATE (PF) 250 MCG/5ML IJ SOLN
INTRAMUSCULAR | Status: AC
  Filled 2020-09-07: qty 5

## 2020-09-07 MED ORDER — AMLODIPINE BESYLATE 10 MG PO TABS
10.0000 mg | ORAL_TABLET | Freq: Every day | ORAL | Status: DC
  Administered 2020-09-08: 10 mg via ORAL
  Filled 2020-09-07: qty 1

## 2020-09-07 MED ORDER — SODIUM CHLORIDE 0.9% FLUSH
3.0000 mL | Freq: Two times a day (BID) | INTRAVENOUS | Status: DC
  Administered 2020-09-07 – 2020-09-08 (×2): 3 mL via INTRAVENOUS

## 2020-09-07 MED ORDER — FERROUS SULFATE 325 (65 FE) MG PO TABS
325.0000 mg | ORAL_TABLET | Freq: Every day | ORAL | Status: DC
  Filled 2020-09-07: qty 1

## 2020-09-07 MED ORDER — MIDAZOLAM HCL 2 MG/2ML IJ SOLN
INTRAMUSCULAR | Status: AC
  Filled 2020-09-07: qty 2

## 2020-09-07 MED ORDER — SODIUM CHLORIDE 0.9 % IV SOLN
INTRAVENOUS | Status: DC
  Administered 2020-09-07: 50 mL/h via INTRAVENOUS

## 2020-09-07 MED ORDER — CHLORHEXIDINE GLUCONATE 4 % EX LIQD: 60.0000 mL | Freq: Once | CUTANEOUS | Status: DC

## 2020-09-07 MED ORDER — IODIXANOL 320 MG/ML IV SOLN
INTRAVENOUS | Status: DC | PRN
  Administered 2020-09-07: 40.8 mL

## 2020-09-07 MED ORDER — ASPIRIN 81 MG PO CHEW: 81.0000 mg | CHEWABLE_TABLET | Freq: Every day | ORAL | Status: DC

## 2020-09-07 MED ORDER — VANCOMYCIN HCL IN DEXTROSE 1-5 GM/200ML-% IV SOLN
1000.0000 mg | Freq: Once | INTRAVENOUS | Status: AC
  Administered 2020-09-07: 1000 mg via INTRAVENOUS
  Filled 2020-09-07: qty 200

## 2020-09-07 MED ORDER — PROPOFOL 500 MG/50ML IV EMUL
INTRAVENOUS | Status: DC | PRN
  Administered 2020-09-07: 10 ug/kg/min via INTRAVENOUS

## 2020-09-07 MED ORDER — PANTOPRAZOLE SODIUM 40 MG PO TBEC
40.0000 mg | DELAYED_RELEASE_TABLET | Freq: Every day | ORAL | Status: DC
  Administered 2020-09-08: 40 mg via ORAL
  Filled 2020-09-07: qty 1

## 2020-09-07 MED ORDER — LIDOCAINE HCL (PF) 1 % IJ SOLN
INTRAMUSCULAR | Status: AC
  Filled 2020-09-07: qty 30

## 2020-09-07 MED ORDER — DEXMEDETOMIDINE HCL IN NACL 400 MCG/100ML IV SOLN
INTRAVENOUS | Status: DC | PRN
  Administered 2020-09-07: 82.52 ug via INTRAVENOUS

## 2020-09-07 MED ORDER — METOPROLOL TARTRATE 5 MG/5ML IV SOLN: 2.5000 mg | INTRAVENOUS | Status: DC | PRN

## 2020-09-07 MED ORDER — SODIUM CHLORIDE 0.9% FLUSH: 3.0000 mL | INTRAVENOUS | Status: DC | PRN

## 2020-09-07 MED ORDER — SODIUM CHLORIDE 0.9 % IV SOLN: INTRAVENOUS | Status: DC

## 2020-09-07 MED ORDER — ATENOLOL 25 MG PO TABS: 50.0000 mg | ORAL_TABLET | Freq: Every day | ORAL | Status: DC

## 2020-09-07 MED ORDER — CHLORHEXIDINE GLUCONATE 0.12 % MT SOLN
OROMUCOSAL | Status: AC
  Administered 2020-09-07: 15 mL via OROMUCOSAL
  Filled 2020-09-07: qty 15

## 2020-09-07 MED ORDER — ACETAMINOPHEN 325 MG PO TABS
650.0000 mg | ORAL_TABLET | Freq: Four times a day (QID) | ORAL | Status: DC | PRN
  Administered 2020-09-07 – 2020-09-08 (×4): 650 mg via ORAL
  Filled 2020-09-07 (×4): qty 2

## 2020-09-07 MED ORDER — HEPARIN SODIUM (PORCINE) 1000 UNIT/ML IJ SOLN
INTRAMUSCULAR | Status: DC | PRN
  Administered 2020-09-07: 12000 [IU] via INTRAVENOUS

## 2020-09-07 MED ORDER — NITROGLYCERIN 0.2 MG/ML ON CALL CATH LAB
INTRAVENOUS | Status: DC | PRN
  Administered 2020-09-07: 20 ug via INTRAVENOUS

## 2020-09-07 MED ORDER — SODIUM CHLORIDE 0.9 % IV SOLN
1.5000 g | Freq: Two times a day (BID) | INTRAVENOUS | Status: DC
  Administered 2020-09-07 – 2020-09-08 (×2): 1.5 g via INTRAVENOUS
  Filled 2020-09-07 (×3): qty 1.5

## 2020-09-07 MED ORDER — CHLORHEXIDINE GLUCONATE 0.12 % MT SOLN: 15.0000 mL | Freq: Once | OROMUCOSAL | Status: AC

## 2020-09-07 MED ORDER — CHLORHEXIDINE GLUCONATE 4 % EX LIQD: 30.0000 mL | CUTANEOUS | Status: DC

## 2020-09-07 MED ORDER — PROTAMINE SULFATE 10 MG/ML IV SOLN
INTRAVENOUS | Status: DC | PRN
  Administered 2020-09-07: 100 mg via INTRAVENOUS
  Administered 2020-09-07: 20 mg via INTRAVENOUS

## 2020-09-07 MED ORDER — SERTRALINE HCL 100 MG PO TABS
100.0000 mg | ORAL_TABLET | Freq: Every day | ORAL | Status: DC
  Administered 2020-09-07 – 2020-09-08 (×2): 100 mg via ORAL
  Filled 2020-09-07 (×2): qty 1

## 2020-09-07 MED ORDER — NITROGLYCERIN IN D5W 200-5 MCG/ML-% IV SOLN: 0.0000 ug/min | INTRAVENOUS | Status: DC

## 2020-09-07 MED ORDER — MIDAZOLAM HCL 2 MG/2ML IJ SOLN
INTRAMUSCULAR | Status: DC | PRN
  Administered 2020-09-07: 2 mg via INTRAVENOUS

## 2020-09-07 MED ORDER — ONDANSETRON HCL 4 MG/2ML IJ SOLN
4.0000 mg | Freq: Four times a day (QID) | INTRAMUSCULAR | Status: DC | PRN
  Administered 2020-09-08 (×2): 4 mg via INTRAVENOUS
  Filled 2020-09-07 (×2): qty 2

## 2020-09-07 MED ORDER — ACETAMINOPHEN 650 MG RE SUPP: 650.0000 mg | Freq: Four times a day (QID) | RECTAL | Status: DC | PRN

## 2020-09-07 MED ORDER — LACTATED RINGERS IV SOLN: INTRAVENOUS | Status: DC

## 2020-09-07 MED ORDER — LIDOCAINE HCL 1 % IJ SOLN
INTRAMUSCULAR | Status: DC | PRN
  Administered 2020-09-07: 30 mL

## 2020-09-07 MED ORDER — PHENYLEPHRINE HCL-NACL 20-0.9 MG/250ML-% IV SOLN
0.0000 ug/min | INTRAVENOUS | Status: DC
  Filled 2020-09-07: qty 250

## 2020-09-07 MED ORDER — 0.9 % SODIUM CHLORIDE (POUR BTL) OPTIME
TOPICAL | Status: DC | PRN
  Administered 2020-09-07: 1000 mL

## 2020-09-07 MED ORDER — SODIUM CHLORIDE 0.9 % IV SOLN: 250.0000 mL | INTRAVENOUS | Status: DC | PRN

## 2020-09-07 MED ORDER — SODIUM CHLORIDE 0.9 % IV SOLN
INTRAVENOUS | Status: DC | PRN
  Administered 2020-09-07 (×3): 500 mL

## 2020-09-07 MED ORDER — SODIUM CHLORIDE 0.9 % IV SOLN
INTRAVENOUS | Status: AC
  Filled 2020-09-07 (×3): qty 1.2

## 2020-09-07 MED ORDER — ASPIRIN EC 81 MG PO TBEC
81.0000 mg | DELAYED_RELEASE_TABLET | Freq: Every day | ORAL | Status: DC
  Administered 2020-09-08: 81 mg via ORAL
  Filled 2020-09-07: qty 1

## 2020-09-07 MED ORDER — DEXMEDETOMIDINE HCL IN NACL 400 MCG/100ML IV SOLN: INTRAVENOUS | Status: DC | PRN

## 2020-09-07 MED ORDER — CLOPIDOGREL BISULFATE 75 MG PO TABS
75.0000 mg | ORAL_TABLET | Freq: Every day | ORAL | Status: DC
  Administered 2020-09-08: 75 mg via ORAL
  Filled 2020-09-07: qty 1

## 2020-09-07 MED FILL — Magnesium Sulfate Inj 50%: INTRAMUSCULAR | Qty: 10 | Status: AC

## 2020-09-07 MED FILL — Heparin Sodium (Porcine) Inj 1000 Unit/ML: INTRAMUSCULAR | Qty: 30 | Status: AC

## 2020-09-07 MED FILL — Potassium Chloride Inj 2 mEq/ML: INTRAVENOUS | Qty: 40 | Status: AC

## 2020-09-07 SURGICAL SUPPLY — 88 items
BAG DECANTER FOR FLEXI CONT (MISCELLANEOUS) IMPLANT
BAG SNAP BAND KOVER 36X36 (MISCELLANEOUS) ×3 IMPLANT
BLADE CLIPPER SURG (BLADE) ×3 IMPLANT
BLADE STERNUM SYSTEM 6 (BLADE) IMPLANT
BLADE SURG 10 STRL SS (BLADE) IMPLANT
CABLE ADAPT CONN TEMP 6FT (ADAPTER) ×3 IMPLANT
CANISTER SUCT 3000ML PPV (MISCELLANEOUS) IMPLANT
CATH DIAG EXPO 6F AL1 (CATHETERS) IMPLANT
CATH DIAG EXPO 6F VENT PIG 145 (CATHETERS) ×6 IMPLANT
CATH EXTERNAL FEMALE PUREWICK (CATHETERS) IMPLANT
CATH INFINITI 6F AL2 (CATHETERS) IMPLANT
CATH S G BIP PACING (CATHETERS) ×3 IMPLANT
CHLORAPREP W/TINT 26 (MISCELLANEOUS) ×3 IMPLANT
CLIP VESOCCLUDE MED 24/CT (CLIP) IMPLANT
CLIP VESOCCLUDE SM WIDE 24/CT (CLIP) IMPLANT
CLOSURE MYNX CONTROL 6F/7F (Vascular Products) ×3 IMPLANT
CNTNR URN SCR LID CUP LEK RST (MISCELLANEOUS) ×4 IMPLANT
CONT SPEC 4OZ STRL OR WHT (MISCELLANEOUS) ×6
COVER BACK TABLE 80X110 HD (DRAPES) IMPLANT
COVER WAND RF STERILE (DRAPES) ×3 IMPLANT
DECANTER SPIKE VIAL GLASS SM (MISCELLANEOUS) ×3 IMPLANT
DERMABOND ADHESIVE PROPEN (GAUZE/BANDAGES/DRESSINGS) ×2
DERMABOND ADVANCED (GAUZE/BANDAGES/DRESSINGS) ×1
DERMABOND ADVANCED .7 DNX12 (GAUZE/BANDAGES/DRESSINGS) ×2 IMPLANT
DERMABOND ADVANCED .7 DNX6 (GAUZE/BANDAGES/DRESSINGS) ×4 IMPLANT
DEVICE CLOSURE PERCLS PRGLD 6F (VASCULAR PRODUCTS) ×4 IMPLANT
DRAPE INCISE IOBAN 66X45 STRL (DRAPES) IMPLANT
DRSG TEGADERM 4X4.75 (GAUZE/BANDAGES/DRESSINGS) ×6 IMPLANT
ELECT CAUTERY BLADE 6.4 (BLADE) IMPLANT
ELECT REM PT RETURN 9FT ADLT (ELECTROSURGICAL) ×6
ELECTRODE REM PT RTRN 9FT ADLT (ELECTROSURGICAL) ×4 IMPLANT
FELT TEFLON 6X6 (MISCELLANEOUS) ×3 IMPLANT
GAUZE SPONGE 4X4 12PLY STRL (GAUZE/BANDAGES/DRESSINGS) ×6 IMPLANT
GLOVE BIO SURGEON STRL SZ 6.5 (GLOVE) ×6 IMPLANT
GLOVE BIO SURGEON STRL SZ7.5 (GLOVE) IMPLANT
GLOVE BIO SURGEON STRL SZ8 (GLOVE) ×6 IMPLANT
GLOVE EUDERMIC 7 POWDERFREE (GLOVE) ×3 IMPLANT
GLOVE ORTHO TXT STRL SZ7.5 (GLOVE) IMPLANT
GOWN STRL REUS W/ TWL LRG LVL3 (GOWN DISPOSABLE) ×6 IMPLANT
GOWN STRL REUS W/ TWL XL LVL3 (GOWN DISPOSABLE) ×6 IMPLANT
GOWN STRL REUS W/TWL LRG LVL3 (GOWN DISPOSABLE) ×9
GOWN STRL REUS W/TWL XL LVL3 (GOWN DISPOSABLE) ×9
GUIDEWIRE SAF TJ AMPL .035X180 (WIRE) ×3 IMPLANT
GUIDEWIRE SAFE TJ AMPLATZ EXST (WIRE) ×3 IMPLANT
INSERT FOGARTY SM (MISCELLANEOUS) IMPLANT
KIT BASIN OR (CUSTOM PROCEDURE TRAY) ×3 IMPLANT
KIT HEART LEFT (KITS) ×3 IMPLANT
KIT SUCTION CATH 14FR (SUCTIONS) IMPLANT
KIT TURNOVER KIT B (KITS) ×3 IMPLANT
LOOP VESSEL MAXI BLUE (MISCELLANEOUS) IMPLANT
LOOP VESSEL MINI RED (MISCELLANEOUS) IMPLANT
NS IRRIG 1000ML POUR BTL (IV SOLUTION) ×3 IMPLANT
PACK ENDO MINOR (CUSTOM PROCEDURE TRAY) ×3 IMPLANT
PAD ARMBOARD 7.5X6 YLW CONV (MISCELLANEOUS) ×6 IMPLANT
PAD ELECT DEFIB RADIOL ZOLL (MISCELLANEOUS) ×3 IMPLANT
PENCIL BUTTON HOLSTER BLD 10FT (ELECTRODE) IMPLANT
PERCLOSE PROGLIDE 6F (VASCULAR PRODUCTS) ×6
POSITIONER HEAD DONUT 9IN (MISCELLANEOUS) ×3 IMPLANT
SET MICROPUNCTURE 5F STIFF (MISCELLANEOUS) ×3 IMPLANT
SHEATH BRITE TIP 7FR 35CM (SHEATH) ×3 IMPLANT
SHEATH PINNACLE 6F 10CM (SHEATH) ×3 IMPLANT
SHEATH PINNACLE 8F 10CM (SHEATH) ×3 IMPLANT
SLEEVE REPOSITIONING LENGTH 30 (MISCELLANEOUS) ×3 IMPLANT
SPONGE LAP 18X18 X RAY DECT (DISPOSABLE) ×3 IMPLANT
STOPCOCK MORSE 400PSI 3WAY (MISCELLANEOUS) ×6 IMPLANT
SUT ETHIBOND X763 2 0 SH 1 (SUTURE) IMPLANT
SUT GORETEX CV 4 TH 22 36 (SUTURE) IMPLANT
SUT GORETEX CV4 TH-18 (SUTURE) IMPLANT
SUT MNCRL AB 3-0 PS2 18 (SUTURE) IMPLANT
SUT PROLENE 5 0 C 1 36 (SUTURE) IMPLANT
SUT PROLENE 6 0 C 1 30 (SUTURE) IMPLANT
SUT SILK  1 MH (SUTURE) ×3
SUT SILK 1 MH (SUTURE) ×2 IMPLANT
SUT VIC AB 2-0 CT1 27 (SUTURE)
SUT VIC AB 2-0 CT1 TAPERPNT 27 (SUTURE) IMPLANT
SUT VIC AB 2-0 CTX 36 (SUTURE) IMPLANT
SUT VIC AB 3-0 SH 8-18 (SUTURE) IMPLANT
SYR 50ML LL SCALE MARK (SYRINGE) ×3 IMPLANT
SYR BULB IRRIG 60ML STRL (SYRINGE) IMPLANT
SYR MEDRAD MARK V 150ML (SYRINGE) ×3 IMPLANT
TOWEL GREEN STERILE (TOWEL DISPOSABLE) ×6 IMPLANT
TRANSDUCER W/STOPCOCK (MISCELLANEOUS) ×6 IMPLANT
TRAY FOLEY SLVR 14FR TEMP STAT (SET/KITS/TRAYS/PACK) IMPLANT
TUBE SUCT INTRACARD DLP 20F (MISCELLANEOUS) IMPLANT
VALVE 26 ULTRA SAPIEN (Valve) ×3 IMPLANT
VALVE 26 ULTRA SAPIEN KIT (Valve) ×3 IMPLANT
WIRE EMERALD 3MM-J .035X150CM (WIRE) ×3 IMPLANT
WIRE EMERALD 3MM-J .035X260CM (WIRE) ×3 IMPLANT

## 2020-09-07 NOTE — Anesthesia Preprocedure Evaluation (Signed)
Anesthesia Evaluation  Patient identified by MRN, date of birth, ID band Patient awake    Reviewed: Allergy & Precautions, NPO status , Patient's Chart, lab work & pertinent test results  Airway Mallampati: I  TM Distance: >3 FB Neck ROM: Full    Dental   Pulmonary former smoker,    Pulmonary exam normal        Cardiovascular hypertension, Pt. on medications Normal cardiovascular exam     Neuro/Psych    GI/Hepatic GERD  Medicated and Controlled,  Endo/Other  diabetes, Type 2  Renal/GU      Musculoskeletal   Abdominal   Peds  Hematology   Anesthesia Other Findings   Reproductive/Obstetrics                             Anesthesia Physical Anesthesia Plan  ASA: III  Anesthesia Plan: MAC   Post-op Pain Management:    Induction: Intravenous  PONV Risk Score and Plan: 2  Airway Management Planned: LMA  Additional Equipment:   Intra-op Plan:   Post-operative Plan:   Informed Consent: I have reviewed the patients History and Physical, chart, labs and discussed the procedure including the risks, benefits and alternatives for the proposed anesthesia with the patient or authorized representative who has indicated his/her understanding and acceptance.       Plan Discussed with: CRNA and Surgeon  Anesthesia Plan Comments:         Anesthesia Quick Evaluation

## 2020-09-07 NOTE — Discharge Instructions (Signed)
ACTIVITY AND EXERCISE °• Daily activity and exercise are an important part of your recovery. People recover at different rates depending on their general health and type of valve procedure. °• Most people recovering from TAVR feel better relatively quickly  °• No lifting, pushing, pulling more than 10 pounds (examples to avoid: groceries, vacuuming, gardening, golfing): °            - For one week with a procedure through the groin. °            - For six weeks for procedures through the chest wall or neck. °NOTE: You will typically see one of our providers 7-14 days after your procedure to discuss WHEN TO RESUME the above activities.  °  °  °DRIVING °• Do not drive until you are seen for follow up and cleared by a provider. Generally, we ask patient to not drive for 1 week after their procedure. °• If you have been told by your doctor in the past that you may not drive, you must talk with him/her before you begin driving again. °  °DRESSING °• Groin site: you may leave the clear dressing over the site for up to one week or until it falls off. °  °HYGIENE °• If you had a femoral (leg) procedure, you may take a shower when you return home. After the shower, pat the site dry. Do NOT use powder, oils or lotions in your groin area until the site has completely healed. °• If you had a chest procedure, you may shower when you return home unless specifically instructed not to by your discharging practitioner. °            - DO NOT scrub incision; pat dry with a towel. °            - DO NOT apply any lotions, oils, powders to the incision. °            - No tub baths / swimming for at least 2 weeks. °• If you notice any fevers, chills, increased pain, swelling, bleeding or pus, please contact your doctor. °  °ADDITIONAL INFORMATION °• If you are going to have an upcoming dental procedure, please contact our office as you will require antibiotics ahead of time to prevent infection on your heart valve.  ° ° °If you have any  questions or concerns you can call the structural heart phone during normal business hours 8am-4pm. If you have an urgent need after hours or weekends please call [phone] to talk to the on call provider for general cardiology. If you have an emergency that requires immediate attention, please call 911.  ° ° °After TAVR Checklist ° °Check  Test Description  ° Follow up appointment in 1-2 weeks  You will see our structural heart physician assistant, Mary Reed. Your incision sites will be checked and you will be cleared to drive and resume all normal activities if you are doing well.    ° 1 month echo and follow up  You will have an echo to check on your new heart valve and be seen back in the office by Mary Reed. Many times the echo is not read by your appointment time, but Mary will call you later that day or the following day to report your results.  ° Follow up with your primary cardiologist You will need to be seen by your primary cardiologist in the following 3-6 months after your 1 month appointment in the valve   clinic. Often times your Plavix or Aspirin will be discontinued during this time, but this is decided on a case by case basis.   ° 1 year echo and follow up You will have another echo to check on your heart valve after 1 year and be seen back in the office by Mary Reed. This your last structural heart visit.  ° Bacterial endocarditis prophylaxis  You will have to take antibiotics for the rest of your life before all dental procedures (even teeth cleanings) to protect your heart valve. Antibiotics are also required before some surgeries. Please check with your cardiologist before scheduling any surgeries. Also, please make sure to tell us if you have a penicillin allergy as you will require an alternative antibiotic.   ° ° °

## 2020-09-07 NOTE — Progress Notes (Signed)
  Echocardiogram 2D Echocardiogram has been performed.  Mary Reed 09/07/2020, 1:23 PM

## 2020-09-07 NOTE — Progress Notes (Signed)
Patient requested to have Marlou Sa (friend) contacted after surgery for update. [phone]

## 2020-09-07 NOTE — Progress Notes (Signed)
Patient ID: Mary Reed, female   DOB: June 29, 1951, 69 y.o.   MRN: [id number] TAVR Team Note:  She just got up to 4E. Feels well. Hemodynamically stable Alert and awake, neuro intact. Rhythm is sinus brady 56 on monitor. LBBB, 1st degree AV block. Postop ECG shows sinus 69, new LBBB. 1st degree AV block.  RRR, no murmur. Groin sites look good.  Continue to monitor rhythm.

## 2020-09-07 NOTE — Consult Note (Signed)
[facility]   MULTIDISCIPLINARY HEART VALVE TEAM  Date:  09/07/2020   ID:  Mary Reed, DOB Jun 14, 1951, MRN [id number]  PCP:  Deland Pretty, MD   No chief complaint on file.    HISTORY OF PRESENT ILLNESS: Mary Reed is a 69 y.o. female who presents for TAVR, referred by Dr Einar Gip.  The patient has undergone heart team evaluation and is felt to be an appropriate candidate for TAVR.  She is 69 years old and has been followed by Dr. Einar Gip.  She has a history of hypertension, mixed hyperlipidemia, asymptomatic carotid stenosis, and type 2 diabetes.  The patient works as a Theme park manager.  She is able to walk her dog 3 times per day but has developed exertional dyspnea with walking up any hills.  She denies chest pain or pressure.  She denies dizziness or syncope.  Diagnostic evaluation includes an echocardiogram which demonstrates LVEF 50 to 55%, severe LVH, normal wall motion, and severe aortic stenosis with peak and mean gradients of 81 and 50 mmHg, respectively.  Peak transaortic velocity is 4.5 m/s.  Dimensionless index of 0.2.  There is mild mitral and tricuspid regurgitation.  Cardiac catheterization demonstrated angiographically normal coronary arteries.  CTA studies demonstrate a functionally bicuspid aortic valve with annular area of 457 mm, normal aortic root, and adequate iliofemoral arteries for transfemoral TAVR access.  Past Medical History:  Diagnosis Date  . Allergy    seasonal  . Arthritis   . Cancer ([place])    skin cancer (not melanoma)  . DES exposure in utero   . Diabetes mellitus without complication ([place])   . GERD (gastroesophageal reflux disease)   . Herpes   . Hypertension   . Severe aortic stenosis   . STD (sexually transmitted disease)    HSV II  . Urinary incontinence    with exercise/cough/laughing    Current Facility-Administered Medications  Medication Dose Route Frequency Provider Last Rate Last Admin  . [START ON 09/08/2020]  0.9 %  sodium chloride infusion   Intravenous Continuous Eileen Stanford, PA-C 75 mL/hr at 09/07/20 1126 Continued from Pre-op at 09/07/20 1126  . 0.9 % irrigation (POUR BTL)    PRN Sherren Mocha, MD   1,000 mL at 09/07/20 1109  . cefUROXime (ZINACEF) 1.5 g in sodium chloride 0.9 % 100 mL IVPB  1.5 g Intravenous To OR Sherren Mocha, MD      . chlorhexidine (HIBICLENS) 4 % liquid 2 application  30 mL Topical UD Eileen Stanford, PA-C      . chlorhexidine (HIBICLENS) 4 % liquid 4 application  60 mL Topical Once Eileen Stanford, PA-C       And  . Derrill Memo ON 09/08/2020] chlorhexidine (HIBICLENS) 4 % liquid 4 application  60 mL Topical Once Eileen Stanford, PA-C      . dexmedetomidine (PRECEDEX) 400 MCG/100ML (4 mcg/mL) infusion  0.1-0.7 mcg/kg/hr Intravenous To OR Sherren Mocha, MD      . heparin 30,000 units/NS 1000 mL solution for CELLSAVER   Other To OR Sherren Mocha, MD      . heparin 6,000 Units in sodium chloride 0.9 % 500 mL irrigation    PRN Sherren Mocha, MD   500 mL at 09/07/20 1054  . iodixanol (VISIPAQUE) 320 MG/ML injection    PRN Sherren Mocha, MD   150 mL at 09/07/20 1051  . lactated ringers infusion   Intravenous Continuous Lillia Abed, MD 10 mL/hr at 09/07/20 1126 Continued from Pre-op  at 09/07/20 1126  . lidocaine (XYLOCAINE) 1 % (with pres) injection    PRN Sherren Mocha, MD   30 mL at 09/07/20 1108  . magnesium sulfate (IV Push/IM) injection 40 mEq  40 mEq Other To OR Sherren Mocha, MD      . norepinephrine (LEVOPHED) 4mg  in 241mL premix infusion  0-10 mcg/min Intravenous To OR Sherren Mocha, MD      . potassium chloride injection 80 mEq  80 mEq Other To OR Sherren Mocha, MD      . vancomycin (VANCOREADY) IVPB 1250 mg/250 mL  1,250 mg Intravenous To OR Sherren Mocha, MD        ALLERGIES:   Codeine   SOCIAL HISTORY:  The patient  reports that she quit smoking about 36 years ago. Her smoking use included cigarettes. She has never used smokeless  tobacco. She reports that she does not drink alcohol and does not use drugs.   FAMILY HISTORY:  The patient's family history includes Breast cancer in her maternal grandmother; Cancer in her father; Diabetes in her paternal grandmother; Heart disease in her father; Hypertension in her father; Stroke in her father.   REVIEW OF SYSTEMS: Negative except as per HPI.  All other systems are reviewed and negative.   PHYSICAL EXAM: VS:  BP (!) 188/70   Pulse 72   Temp 98.2 F (36.8 C)   Resp 20   Ht 5\' 9"  (1.753 m)   Wt 82.5 kg   LMP 11/20/2006 (Approximate)   SpO2 98%   BMI 26.86 kg/m  , BMI Body mass index is 26.86 kg/m. GEN: Well nourished, well developed, in no acute distress HEENT: normal Neck: No JVD. carotids 2+ with bilateral bruits Cardiac: The heart is RRR with grade 4/6 harsh late peaking systolic murmur at the right upper sternal border.  No edema. Pedal pulses 2+ = bilaterally  Respiratory:  clear to auscultation bilaterally GI: soft, nontender, nondistended, + BS MS: no deformity or atrophy Skin: warm and dry, no rash Neuro:  Strength and sensation are intact Psych: euthymic mood, full affect  EKG:  EKG from 09/03/2020 reviewed and demonstrates sinus bradycardia 58 bpm, LVH, otherwise normal  RECENT LABS: 09/03/2020: ALT 15; B Natriuretic Peptide 287.0; BUN 7; Creatinine, Ser 0.64; Hemoglobin 12.3; Platelets 182; Potassium 3.6; Sodium 138  No results found for requested labs within last 8760 hours.   Estimated Creatinine Clearance: 75.1 mL/min (by C-G formula based on SCr of 0.64 mg/dL).   Wt Readings from Last 3 Encounters:  09/07/20 82.5 kg  09/03/20 82.5 kg  08/18/20 82.6 kg     CARDIAC STUDIES: Reviewed as outlined in the HPI  STS RISK CALCULATOR: Procedure: Isolated AVR Risk of Mortality: 1.123% Renal Failure: 0.954% Permanent Stroke: 1.374% Prolonged Ventilation: 4.000% DSW Infection: 0.050% Reoperation: 3.109% Morbidity or  Mortality: 6.887% Short Length of Stay: 49.867% Long Length of Stay: 2.519%  ASSESSMENT AND PLAN: 69 year old woman with severe, stage D1 aortic stenosis and New York Heart Association functional class II symptoms of exertional dyspnea/chronic diastolic heart failure.  She is noted to have a mean transaortic gradient of 50 mmHg.  Her exam is clearly consistent with severe aortic stenosis as well.  Cardiac catheterization and CTA images were personally reviewed.  The patient has been counseled regarding the pros and cons of conventional surgical AVR versus TAVR.  She strongly desires TAVR based on a variety of factors, most importantly related to her lifestyle and occupation.  We plan to proceed with transfemoral TAVR  with a 26 mm sapien 3 ultra valve.  Risks, indications, and alternatives of TAVR have been reviewed with the patient at length.  Deatra James 09/07/2020 11:28 AM     [facility] [address] [place] [address]  [phone] (office) [phone] (fax)

## 2020-09-07 NOTE — Transfer of Care (Signed)
Immediate Anesthesia Transfer of Care Note  Patient: Mary Reed  Procedure(s) Performed: TRANSCATHETER AORTIC VALVE REPLACEMENT, TRANSFEMORAL USING EDWARDS SAPIEN VALVE SIZE 26MM (N/A Chest) TRANSESOPHAGEAL ECHOCARDIOGRAM (TEE) (N/A )  Patient Location: Cath lab  Anesthesia Type:MAC  Level of Consciousness: drowsy, patient cooperative and responds to stimulation  Airway & Oxygen Therapy: Patient Spontanous Breathing and Patient connected to face mask oxygen  Post-op Assessment: Report given to RN, Post -op Vital signs reviewed and stable and Patient moving all extremities  Post vital signs: Reviewed and stable  Last Vitals:  Vitals Value Taken Time  BP 103/53 09/07/20 1331  Temp    Pulse 68 09/07/20 1334  Resp 15 09/07/20 1334  SpO2 96 % 09/07/20 1334  Vitals shown include unvalidated device data.  Last Pain:  Vitals:   09/07/20 0912  PainSc: 0-No pain         Complications: No complications documented.

## 2020-09-07 NOTE — Interval H&P Note (Signed)
History and Physical Interval Note:  09/07/2020 9:48 AM  Mary Reed  has presented today for surgery, with the diagnosis of Severe Aortic Stenosis.  The various methods of treatment have been discussed with the patient and family. After consideration of risks, benefits and other options for treatment, the patient has consented to  Procedure(s): TRANSCATHETER AORTIC VALVE REPLACEMENT, TRANSFEMORAL (N/A) TRANSESOPHAGEAL ECHOCARDIOGRAM (TEE) (N/A) as a surgical intervention.  The patient's history has been reviewed, patient examined, no change in status, stable for surgery.  I have reviewed the patient's chart and labs.  Questions were answered to the patient's satisfaction.     Gaye Pollack

## 2020-09-07 NOTE — Anesthesia Procedure Notes (Signed)
Arterial Line Insertion Start/End10/19/2021 10:35 AM, 09/07/2020 10:40 AM Performed by: Rande Brunt, CRNA  Patient location: Pre-op. Preanesthetic checklist: patient identified, IV checked, site marked, risks and benefits discussed, surgical consent, monitors and equipment checked, pre-op evaluation, timeout performed and anesthesia consent Lidocaine 1% used for infiltration Right, radial was placed Catheter size: 20 G Hand hygiene performed  and maximum sterile barriers used  Allen's test indicative of satisfactory collateral circulation Attempts: 1 Procedure performed without using ultrasound guided technique. Following insertion, dressing applied and Biopatch. Post procedure assessment: normal and unchanged  Patient tolerated the procedure well with no immediate complications.

## 2020-09-07 NOTE — Op Note (Signed)
[facility]   MULTIDISCIPLINARY HEART VALVE TEAM   TAVR OPERATIVE NOTE   Date of Procedure:  09/07/2020  Preoperative Diagnosis: Severe Aortic Stenosis   Postoperative Diagnosis: Same   Procedure:    Transcatheter Aortic Valve Replacement - Percutaneous Right Transfemoral Approach  Edwards Sapien 3 Ultra THV (size 26 mm, model # 9750TFX, serial # [id number])   Co-Surgeons:  Gaye Pollack, MD and Sherren Mocha, MD    Anesthesiologist:  Irene Limbo, MD  Echocardiographer:  Liane Comber, MD  Pre-operative Echo Findings:  Severe aortic stenosis  Normal left ventricular systolic function  Post-operative Echo Findings:  No paravalvular leak  Normal left ventricular systolic function   BRIEF CLINICAL NOTE AND INDICATIONS FOR SURGERY  This 69 year old woman has stage D, severe, symptomatic aortic stenosis with New York Heart Association class II symptoms of exertional shortness of breath while walking up hills. I personally reviewed her 2D echocardiogram, cardiac catheterization, and CTA studies. Her echocardiogram shows a calcified aortic valve with restricted leaflet mobility. The mean gradient was measured at 49.9 mmHg with a valve area of 0.8 cm consistent with severe aortic stenosis. Left ventricular systolic function is normal. Her cardiac catheterization shows no coronary disease. I agree that aortic valve replacement is indicated in this patient to prevent progressive left ventricular deterioration. I reviewed the echocardiogram, cardiac cath, and CTA images with her and answered her questions. I discussed the options of open surgical aortic valve replacement and transcatheter aortic valve replacement. She is firmly against open surgical AVR unless TAVR was just not an option. She is a low risk surgical patient but I think TAVR is a reasonable option for her given her age. I discussed the risk of structural valve deterioration and unknown long-term durability with  transcatheter valves. I also discussed the possibility that we may not be able to repeat TAVR if a transcatheter valve suffered structural deterioration. She understands all this and would like to proceed with TAVR if possible. Her gated cardiac CTA shows anatomy suitable for transcatheter aortic valve placement using a SAPIEN 3 valve. Her abdominal and pelvic CTA shows adequate pelvic vascular anatomy to allow transfemoral insertion.   The patient was counseled at length regarding treatment alternatives for management of severe symptomatic aortic stenosis. The risks and benefits of surgical intervention has been discussed in detail. Long-term prognosis with medical therapy was discussed. Alternative approaches such as conventional surgical aortic valve replacement, transcatheter aortic valve replacement, and palliative medical therapy were compared and contrasted at length. This discussion was placed in the context of the patient's own specific clinical presentation and past medical history. All of her questions have been addressed.  Following the decision to proceed with transcatheter aortic valve replacement, a discussion was held regarding what types of management strategies would be attempted intraoperatively in the event of life-threatening complications, including whether or not the patient would be considered a candidate for the use of cardiopulmonary bypass and/or conversion to open sternotomy for attempted surgical intervention. The patient is aware of the fact that transient use of cardiopulmonary bypass may be necessary. She is a low risk surgical patient and would be considered a candidate for emergent sternotomy to manage any intraoperative complications. The patient has been advised of a variety of complications that might develop including but not limited to risks of death, stroke, paravalvular leak, aortic dissection or other major vascular complications, aortic annulus rupture, device  embolization, cardiac rupture or perforation, mitral regurgitation, acute myocardial infarction, arrhythmia, heart block or  bradycardia requiring permanent pacemaker placement, congestive heart failure, respiratory failure, renal failure, pneumonia, infection, other late complications related to structural valve deterioration or migration, or other complications that might ultimately cause a temporary or permanent loss of functional independence or other long term morbidity. The patient provides full informed consent for the procedure as described and all questions were answered.    DETAILS OF THE OPERATIVE PROCEDURE  PREPARATION:    The patient was brought to the operating room on the above mentioned date and appropriate monitoring was established by the anesthesia team. The patient was placed in the supine position on the operating table.  Intravenous antibiotics were administered. The patient was monitored closely throughout the procedure under conscious sedation.    Baseline transthoracic echocardiogram was performed. The patient's abdomen and both groins were prepped and draped in a sterile manner. A time out procedure was performed.   PERIPHERAL ACCESS:    Using the modified Seldinger technique, femoral arterial and venous access was obtained with placement of 6 Fr sheaths on the left side.  A pigtail diagnostic catheter was passed through the left arterial sheath under fluoroscopic guidance into the aortic root.  A temporary transvenous pacemaker catheter was passed through the left femoral venous sheath under fluoroscopic guidance into the right ventricle.  The pacemaker was tested to ensure stable lead placement and pacemaker capture. Aortic root angiography was performed in order to determine the optimal angiographic angle for valve deployment.   TRANSFEMORAL ACCESS:   Percutaneous transfemoral access and sheath placement was performed using ultrasound guidance.  The right common femoral  artery was cannulated using a micropuncture needle and appropriate location was verified using hand injection angiogram.  A pair of Abbott Perclose percutaneous closure devices were placed and a 6 French sheath replaced into the femoral artery.  The patient was heparinized systemically and ACT verified > 250 seconds.    A 14 Fr transfemoral E-sheath was introduced into the right common femoral artery after progressively dilating over an Amplatz superstiff wire. An AL-1 catheter was used to direct a straight-tip exchange length wire across the native aortic valve into the left ventricle. This was exchanged out for a pigtail catheter and position was confirmed in the LV apex. Simultaneous LV and Ao pressures were recorded.  The pigtail catheter was exchanged for an Amplatz Extra-stiff wire in the LV apex.    BALLOON AORTIC VALVULOPLASTY:   Not performed   TRANSCATHETER HEART VALVE DEPLOYMENT:   An Edwards Sapien 3 Ultra transcatheter heart valve (size 26 mm) was prepared and crimped per manufacturer's guidelines, and the proper orientation of the valve is confirmed on the [employer] delivery system. 1 cc of volume was removed from the balloon since the valve was 17% oversized.  The valve was advanced through the introducer sheath using normal technique until in an appropriate position in the abdominal aorta beyond the sheath tip. The balloon was then retracted and using the fine-tuning wheel was centered on the valve. The valve was then advanced across the aortic arch using appropriate flexion of the catheter. The valve was carefully positioned across the aortic valve annulus. The Commander catheter was retracted using normal technique. Once final position of the valve has been confirmed by angiographic assessment, the valve is deployed while temporarily holding ventilation and during rapid ventricular pacing to maintain systolic blood pressure < 50 mmHg and pulse pressure < 10 mmHg. The balloon  inflation is held for >3 seconds after reaching full deployment volume. Once the balloon has  fully deflated the balloon is retracted into the ascending aorta and valve function is assessed using echocardiography. The mean gradient was 7 mm Hg. There is felt to be no paravalvular leak and no central aortic insufficiency.  The patient's hemodynamic recovery following valve deployment is good.  The deployment balloon and guidewire are both removed.    PROCEDURE COMPLETION:   The sheath was removed and femoral artery closure performed.  Protamine was administered once femoral arterial repair was complete. The temporary pacemaker, pigtail catheters and femoral sheaths were removed with manual pressure used for hemostasis.  A Mynx femoral closure device was utilized following removal of the diagnostic sheath in the left femoral artery.  The patient tolerated the procedure well and is transported to the cath lab recovery area in stable condition. There were no immediate intraoperative complications. All sponge instrument and needle counts are verified correct at completion of the operation.   No blood products were administered during the operation.  The patient received a total of 40.8 mL of intravenous contrast during the procedure.   Gaye Pollack, MD 09/07/2020

## 2020-09-07 NOTE — Anesthesia Postprocedure Evaluation (Signed)
Anesthesia Post Note  Patient: Mary Reed  Procedure(s) Performed: TRANSCATHETER AORTIC VALVE REPLACEMENT, TRANSFEMORAL USING EDWARDS SAPIEN VALVE SIZE 26MM (N/A Chest) TRANSESOPHAGEAL ECHOCARDIOGRAM (TEE) (N/A )     Patient location during evaluation: PACU Anesthesia Type: MAC Level of consciousness: awake and alert Pain management: pain level controlled Vital Signs Assessment: post-procedure vital signs reviewed and stable Respiratory status: spontaneous breathing, nonlabored ventilation, respiratory function stable and patient connected to nasal cannula oxygen Cardiovascular status: stable and blood pressure returned to baseline Postop Assessment: no apparent nausea or vomiting Anesthetic complications: no   No complications documented.  Last Vitals:  Vitals:   09/07/20 1335 09/07/20 1340  BP: (!) 101/52 (!) 94/47  Pulse: 68 68  Resp: 15 17  Temp:    SpO2: 96% 95%    Last Pain:  Vitals:   09/07/20 1336  PainSc: 0-No pain                 Korey Prashad DAVID

## 2020-09-07 NOTE — Anesthesia Procedure Notes (Signed)
Procedure Name: MAC Date/Time: 09/07/2020 12:35 AM Performed by: Rande Brunt, CRNA Pre-anesthesia Checklist: Patient identified, Emergency Drugs available, Suction available, Patient being monitored and Timeout performed Oxygen Delivery Method: Simple face mask Placement Confirmation: positive ETCO2 and breath sounds checked- equal and bilateral Dental Injury: Teeth and Oropharynx as per pre-operative assessment

## 2020-09-07 NOTE — Op Note (Signed)
[facility]   MULTIDISCIPLINARY HEART VALVE TEAM   TAVR OPERATIVE NOTE   Date of Procedure:  09/07/2020  Preoperative Diagnosis: Severe Aortic Stenosis   Postoperative Diagnosis: Same   Procedure:    Transcatheter Aortic Valve Replacement - Percutaneous  Transfemoral Approach  Edwards Sapien 3Ultra THV (size 26 mm, model # 9750TFX, serial # [id number])   Co-Surgeons:  Gaye Pollack, MD and Sherren Mocha, MD  Anesthesiologist:  Lillia Abed, MD  Echocardiographer:  Ena Dawley, MD  Pre-operative Echo Findings:  Severe aortic stenosis  Normal left ventricular systolic function  Post-operative Echo Findings:  No paravalvular leak  Normal/unchanged left ventricular systolic function  BRIEF CLINICAL NOTE AND INDICATIONS FOR SURGERY  69 year old woman with severe, stage D1 aortic stenosis and New York Heart Association functional class II symptoms.  Preoperative studies show a mean transaortic gradient of 50 mmHg and valve area of 0.8 cm.  LV function is preserved.  Coronary arteries are normal by catheterization.  After review of her CTA studies, with multidisciplinary heart team review, the patient was adamant that she did not want to have conventional aortic valve replacement via median sternotomy.  We elected to proceed today with TAVR via a transfemoral approach.  During the course of the patient's preoperative work up they have been evaluated comprehensively by a multidisciplinary team of specialists coordinated through the [facility] in the [facility].  They have been demonstrated to suffer from symptomatic severe aortic stenosis as noted above. The patient has been counseled extensively as to the relative risks and benefits of all options for the treatment of severe aortic stenosis including long term medical therapy, conventional surgery for aortic valve replacement, and transcatheter aortic valve  replacement.  The patient has been independently evaluated in formal cardiac surgical consultation by Dr Cyndia Bent, who deemed the patient appropriate for TAVR. Based upon review of all of the patient's preoperative diagnostic tests they are felt to be candidate for transcatheter aortic valve replacement using the transfemoral approach as an alternative to conventional surgery.    Following the decision to proceed with transcatheter aortic valve replacement, a discussion has been held regarding what types of management strategies would be attempted intraoperatively in the event of life-threatening complications, including whether or not the patient would be considered a candidate for the use of cardiopulmonary bypass and/or conversion to open sternotomy for attempted surgical intervention.  The patient has been advised of a variety of complications that might develop peculiar to this approach including but not limited to risks of death, stroke, paravalvular leak, aortic dissection or other major vascular complications, aortic annulus rupture, device embolization, cardiac rupture or perforation, acute myocardial infarction, arrhythmia, heart block or bradycardia requiring permanent pacemaker placement, congestive heart failure, respiratory failure, renal failure, pneumonia, infection, other late complications related to structural valve deterioration or migration, or other complications that might ultimately cause a temporary or permanent loss of functional independence or other long term morbidity.  The patient provides full informed consent for the procedure as described and all questions were answered preoperatively.  DETAILS OF THE OPERATIVE PROCEDURE  PREPARATION:   The patient is brought to the operating room on the above mentioned date and central monitoring was established by the anesthesia team including placement of a central venous catheter and radial arterial line. The patient is placed in the supine  position on the operating table.  Intravenous antibiotics are administered. The patient is monitored closely throughout the procedure under  conscious sedation.  Baseline transthoracic echocardiogram is performed. The patient's chest, abdomen, both groins, and both lower extremities are prepared and draped in a sterile manner. A time out procedure is performed.   PERIPHERAL ACCESS:   Using ultrasound guidance, femoral arterial and venous access is obtained with placement of 6 Fr sheaths on the left side.  A pigtail diagnostic catheter was passed through the femoral arterial sheath under fluoroscopic guidance into the aortic root.  A temporary transvenous pacemaker catheter was passed through the femoral venous sheath under fluoroscopic guidance into the right ventricle.  The pacemaker was tested to ensure stable lead placement and pacemaker capture. Aortic root angiography was performed in order to determine the optimal angiographic angle for valve deployment.  TRANSFEMORAL ACCESS:  A micropuncture technique is used to access the right femoral artery under fluoroscopic and ultrasound guidance.  2 Perclose devices are deployed at 10' and 2' positions to 'PreClose' the femoral artery. An 8 French sheath is placed and then an Amplatz Superstiff wire is advanced through the sheath. This is changed out for a 14 French transfemoral E-Sheath after progressively dilating over the Superstiff wire.  An AL-1 catheter was used to direct a straight-tip exchange length wire across the native aortic valve into the left ventricle. This was exchanged out for a pigtail catheter and position was confirmed in the LV apex. Simultaneous LV and Ao pressures were recorded.  The pigtail catheter was exchanged for an Amplatz Extra-stiff wire in the LV apex.    BALLOON AORTIC VALVULOPLASTY:  Not performed  TRANSCATHETER HEART VALVE DEPLOYMENT:  An Edwards Sapien 3 transcatheter heart valve (size 26 mm) was prepared and crimped  per manufacturer's guidelines, and the proper orientation of the valve is confirmed on the [employer] delivery system. The valve was advanced through the introducer sheath using normal technique until in an appropriate position in the abdominal aorta beyond the sheath tip. The balloon was then retracted and using the fine-tuning wheel was centered on the valve. The valve was then advanced across the aortic arch using appropriate flexion of the catheter. The valve was carefully positioned across the aortic valve annulus. The Commander catheter was retracted using normal technique. Once final position of the valve has been confirmed by angiographic assessment, the valve is deployed while temporarily holding ventilation and during rapid ventricular pacing to maintain systolic blood pressure < 50 mmHg and pulse pressure < 10 mmHg. The balloon inflation is held for >3 seconds after reaching full deployment volume. Once the balloon has fully deflated the balloon is retracted into the ascending aorta and valve function is assessed using echocardiography. The patient's hemodynamic recovery following valve deployment is good.  The deployment balloon and guidewire are both removed. Echo demostrated acceptable post-procedural gradients, stable mitral valve function, and no aortic insufficiency.   PROCEDURE COMPLETION:  The sheath was removed and femoral artery closure is performed using the 2 previously deployed Perclose devices.  Protamine is administered once femoral arterial repair was complete. The site is clear with no evidence of bleeding or hematoma after the sutures are tightened. The temporary pacemaker and pigtail catheters are removed. Mynx closure is used for contralateral femoral arterial hemostasis for the 6 Fr sheath.  The patient tolerated the procedure well and is transported to the surgical intensive care in stable condition. There were no immediate intraoperative complications. All sponge  instrument and needle counts are verified correct at completion of the operation.   The patient received a total of 60 mL of  intravenous contrast during the procedure.  Sherren Mocha, MD 09/07/2020 4:05 PM

## 2020-09-08 ENCOUNTER — Encounter (HOSPITAL_COMMUNITY): Payer: Self-pay | Admitting: Cardiovascular Disease

## 2020-09-08 ENCOUNTER — Inpatient Hospital Stay (HOSPITAL_COMMUNITY): Payer: Medicare HMO

## 2020-09-08 DIAGNOSIS — I350 Nonrheumatic aortic (valve) stenosis: Principal | ICD-10-CM

## 2020-09-08 DIAGNOSIS — I5032 Chronic diastolic (congestive) heart failure: Secondary | ICD-10-CM | POA: Diagnosis not present

## 2020-09-08 DIAGNOSIS — Z952 Presence of prosthetic heart valve: Secondary | ICD-10-CM

## 2020-09-08 DIAGNOSIS — Z006 Encounter for examination for normal comparison and control in clinical research program: Secondary | ICD-10-CM | POA: Diagnosis not present

## 2020-09-08 DIAGNOSIS — Z20822 Contact with and (suspected) exposure to covid-19: Secondary | ICD-10-CM | POA: Diagnosis not present

## 2020-09-08 LAB — CBC
HCT: 36 % (ref 36.0–46.0)
Hemoglobin: 11.9 g/dL — ABNORMAL LOW (ref 12.0–15.0)
MCH: 27.7 pg (ref 26.0–34.0)
MCHC: 33.1 g/dL (ref 30.0–36.0)
MCV: 83.7 fL (ref 80.0–100.0)
Platelets: 140 10*3/uL — ABNORMAL LOW (ref 150–400)
RBC: 4.3 MIL/uL (ref 3.87–5.11)
RDW: 13.2 % (ref 11.5–15.5)
WBC: 7.9 10*3/uL (ref 4.0–10.5)
nRBC: 0 % (ref 0.0–0.2)

## 2020-09-08 LAB — ECHOCARDIOGRAM COMPLETE
AR max vel: 1.14 cm2
AV Area VTI: 1.43 cm2
AV Area mean vel: 1.14 cm2
AV Mean grad: 16 mmHg
AV Peak grad: 29.2 mmHg
Ao pk vel: 2.7 m/s
Area-P 1/2: 3.03 cm2
Height: 69 in
S' Lateral: 3.12 cm
Weight: 2833.6 oz

## 2020-09-08 LAB — BASIC METABOLIC PANEL
Anion gap: 10 (ref 5–15)
BUN: 6 mg/dL — ABNORMAL LOW (ref 8–23)
CO2: 23 mmol/L (ref 22–32)
Calcium: 8.6 mg/dL — ABNORMAL LOW (ref 8.9–10.3)
Chloride: 106 mmol/L (ref 98–111)
Creatinine, Ser: 0.65 mg/dL (ref 0.44–1.00)
GFR, Estimated: >60 mL/min (ref >60)
Glucose, Bld: 123 mg/dL — ABNORMAL HIGH (ref 70–99)
Potassium: 3.4 mmol/L — ABNORMAL LOW (ref 3.5–5.1)
Sodium: 139 mmol/L (ref 135–145)

## 2020-09-08 LAB — MAGNESIUM: Magnesium: 1.9 mg/dL (ref 1.7–2.4)

## 2020-09-08 MED ORDER — CLOPIDOGREL BISULFATE 75 MG PO TABS
75.0000 mg | ORAL_TABLET | Freq: Every day | ORAL | 1 refills | Status: DC
Start: 2020-09-09 — End: 2020-11-17

## 2020-09-08 MED ORDER — POTASSIUM CHLORIDE CRYS ER 20 MEQ PO TBCR
40.0000 meq | EXTENDED_RELEASE_TABLET | Freq: Once | ORAL | Status: AC
  Administered 2020-09-08: 40 meq via ORAL
  Filled 2020-09-08: qty 2

## 2020-09-08 NOTE — Discharge Summary (Signed)
Discharge Summary    Patient ID: Mary Reed MRN: [id number]; DOB: 1951-07-21  Admit date: 09/07/2020 Discharge date: 09/08/2020  Primary Care Provider: Deland Pretty, MD  Primary Cardiologist: Dr. Einar Gip Primary Electrophysiologist:  None   Discharge Diagnoses    Active Problems:   Severe aortic stenosis    Diagnostic Studies/Procedures    Procedure:        Transcatheter Aortic Valve Replacement - Percutaneous  Transfemoral Approach             Edwards Sapien 3Ultra THV (size 26 mm, model # 9750TFX, serial # [id number])              Co-Surgeons:                        Gaye Pollack, MD and Sherren Mocha, MD  Anesthesiologist:                  Lillia Abed, MD  Echocardiographer:              Ena Dawley, MD  Pre-operative Echo Findings: ? Severe aortic stenosis ? Normal left ventricular systolic function  Post-operative Echo Findings: ? No paravalvular leak ? Normal/unchanged left ventricular systolic function _____________   History of Present Illness     Mary Reed is a 69 y.o. female with a history of hypertension, hyperlipidemia, diabetes, asymptomatic moderate bilateral carotid artery stenosis, and moderate aortic stenosis who reported exertional shortness of breath with walking up hills as well as tachypalpitations. She had a follow-up echocardiogram on 06/14/2020 that showed an increase in the mean gradient across aortic valve to 49.9 mmHg with an aortic valve area of 0.8 cm consistent with severe aortic stenosis. Left ventricular ejection fraction is 50 to 55%.  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 She said that she takes her dog for walk 3 times a day and has no problems with that except when she is going up hills. She denied any chest pain. She denied fatigue. She has had no dizziness or syncope. She has recently started having some swelling in her ankles and feet. She has had episodes of  tachypalpitations that lasted 5 to 10 minutes and subside spontaneously. She was referred to the structural heart team and deemed a candidate for TAVR.   Hospital Course     Underwent successful TAVR with Dr. Burt Knack and Dr. Cyndia Bent using Berniece Pap 3Ultra THV (size 26 mm, model # 9750TFX, serial # [id number]). No post op complications were noted. She was continued on ASA 81mg  daily with the addition of plavix 75mg  daily for 6 months. Home medication regimen was resumed at discharge. Noted to have a transient :BBB on EKG post procedurally but improved the following day. She was able to ambulate without complications. Seen by CR. Follow up echo was reviewed by Dr. Burt Knack prior to discharge. Official read is pending at discharge.   Did the patient have an acute coronary syndrome (MI, NSTEMI, STEMI, etc) this admission?:  No                               Did the patient have a percutaneous coronary intervention (stent / angioplasty)?:  No.   _____________  Discharge Vitals Blood pressure 127/75, pulse 99, temperature 99.5 F (37.5 C), temperature source Oral, resp. rate 20, height 5\' 9"  (1.753 m), weight 80.3 kg, last menstrual  period 11/20/2006, SpO2 94 %.  Filed Weights   09/07/20 0833 09/08/20 0510  Weight: 82.5 kg 80.3 kg    Labs & Radiologic Studies    CBC Recent Labs    09/07/20 1355 09/08/20 0241  WBC  --  7.9  HGB 11.2* 11.9*  HCT 33.0* 36.0  MCV  --  83.7  PLT  --  737*   Basic Metabolic Panel Recent Labs    09/07/20 1355 09/08/20 0241  NA 143 139  K 3.1* 3.4*  CL 106 106  CO2  --  23  GLUCOSE 136* 123*  BUN 7* 6*  CREATININE 0.60 0.65  CALCIUM  --  8.6*  MG  --  1.9   Liver Function Tests No results for input(s): AST, ALT, ALKPHOS, BILITOT, PROT, ALBUMIN in the last 72 hours. No results for input(s): LIPASE, AMYLASE in the last 72 hours. High Sensitivity Troponin:   No results for input(s): TROPONINIHS in the last 720 hours.  BNP Invalid input(s):  POCBNP D-Dimer No results for input(s): DDIMER in the last 72 hours. Hemoglobin A1C No results for input(s): HGBA1C in the last 72 hours. Fasting Lipid Panel No results for input(s): CHOL, HDL, LDLCALC, TRIG, CHOLHDL, LDLDIRECT in the last 72 hours. Thyroid Function Tests No results for input(s): TSH, T4TOTAL, T3FREE, THYROIDAB in the last 72 hours.  Invalid input(s): FREET3 _____________  DG Chest 2 View  Result Date: 09/05/2020 CLINICAL DATA:  Aortic stenosis EXAM: CHEST - 2 VIEW COMPARISON:  None. FINDINGS: The heart size and mediastinal contours are within normal limits. Both lungs are clear. The visualized skeletal structures are unremarkable. IMPRESSION: No active cardiopulmonary disease. Electronically Signed   By: Ulyses Jarred M.D.   On: 09/05/2020 04:25   CT CORONARY MORPH W/CTA COR W/SCORE W/CA W/CM &/OR WO/CM  Addendum Date: 08/17/2020   ADDENDUM REPORT: 08/17/2020 12:41 EXAM: OVER-READ INTERPRETATION  CT CHEST The following report is an over-read performed by radiologist Dr. Samara Snide [facility] [place] on 08/17/2020. This over-read does not include interpretation of cardiac or coronary anatomy or pathology. The CTA interpretation by the cardiologist is attached. COMPARISON:  None. FINDINGS: Please see the separate concurrent chest CT angiogram report for details. IMPRESSION: Please see the separate concurrent chest CT angiogram report for details. Electronically Signed   By: Ilona Sorrel M.D.   On: 08/17/2020 12:41   Result Date: 08/17/2020 CLINICAL DATA:  Aortic Stenosis EXAM: Cardiac TAVR CT TECHNIQUE: The patient was scanned on a Siemens Force 106 slice scanner. A 120 kV retrospective scan was triggered in the ascending thoracic aorta at 140 HU's. Gantry rotation speed was 250 msecs and collimation was .6 mm. No beta blockade or nitro were given. The 3D data set was reconstructed in 5% intervals of the R-R cycle. Systolic and diastolic phases were analyzed on a dedicated  work station using MPR, MIP and VRT modes. The patient received 80 cc of contrast. FINDINGS: Aortic Valve: Functionally bicuspid with fused right and left cusps calcified with restricted leaflet motion Aorta: No aneurysm, normal arch vessels and moderate calcific atherosclerosis Sino-tubular Junction: 2.5 cm Ascending Thoracic Aorta: 3.4 cm Aortic Arch: 3.0 cm Descending Thoracic Aorta: 2.3 cm Sinus of Valsalva Measurements: Non-coronary: 32.8 mm Right - coronary: 29.9 mm Left -   coronary: 28 mm Coronary Artery Height above Annulus: Left Main: 13.2 mm above annulus Right Coronary: 15.6 mm above annulus Virtual Basal Annulus Measurements: Maximum / Minimum Diameter: 26.35 x 21.4 mm Perimeter: 79 mm Area: 457 mm2  Coronary Arteries: Sufficient height above annulus for deployment Optimum Fluoroscopic Angle for Delivery: RAO 12 Caudal 8 degrees IMPRESSION: 1. Functionally bicuspid AV with annular area of 457 mm2 suitable for a 26 mm Sapien 3 valve 2.  Normal aortic root 3.4 cm 3.  Coronary arteries suitable height above annulus for deployment 4. Optimum angiographic angle for deployment RAO 12 Caudal 8 degrees Jenkins Rouge Electronically Signed: By: Jenkins Rouge M.D. On: 08/17/2020 12:05   CT ANGIO CHEST AORTA W/CM & OR WO/CM  Result Date: 08/17/2020 CLINICAL DATA:  Severe aortic stenosis.  Pre-TAVR planning. EXAM: CT ANGIOGRAPHY CHEST, ABDOMEN AND PELVIS TECHNIQUE: Multidetector CT imaging through the chest, abdomen and pelvis was performed using the standard protocol during bolus administration of intravenous contrast. Multiplanar reconstructed images and MIPs were obtained and reviewed to evaluate the vascular anatomy. CONTRAST:  184mL OMNIPAQUE IOHEXOL 350 MG/ML SOLN COMPARISON:  11/01/2009 abdominal sonogram. FINDINGS: CTA CHEST FINDINGS Cardiovascular: Mild cardiomegaly. No significant pericardial effusion/thickening. Diffuse thickening and coarse calcification of the aortic valve. Atherosclerotic  nonaneurysmal thoracic aorta. Top-normal caliber main pulmonary artery (3.1 cm diameter). No central pulmonary emboli. Mediastinum/Nodes: Subcentimeter hypodense right thyroid nodule. Not clinically significant; no follow-up imaging recommended (ref: J Am Coll Radiol. 2015 Feb;12(2): 143-50). Unremarkable esophagus. No pathologically enlarged axillary, mediastinal or hilar lymph nodes. Lungs/Pleura: No pneumothorax. No pleural effusion. No acute consolidative airspace disease, lung masses or significant pulmonary nodules. Musculoskeletal: No aggressive appearing focal osseous lesions. Minimal thoracic spondylosis. CTA ABDOMEN AND PELVIS FINDINGS Hepatobiliary: Normal liver size. Nonspecific 1.1 cm indistinct hypervascular focus in the right lower lobe (series 7/image 104). Otherwise no liver lesions. Normal gallbladder with no radiopaque cholelithiasis. No biliary ductal dilatation. Pancreas: Suggestion of a complex 1.7 cm cystic multilocular pancreatic lesion in the uncinate process (series 7/image 129). No additional pancreatic lesions. No main pancreatic duct dilation. Spleen: Mild splenomegaly. Craniocaudal splenic length 13.2 cm. No splenic mass. Adrenals/Urinary Tract: Subcentimeter hypodense renal cortical lesion in the anterior upper left kidney is too small to characterize and requires no follow-up. No additional contour deforming renal lesions. No hydronephrosis. No contour deforming renal masses. Normal bladder. Stomach/Bowel: Small hiatal hernia. Otherwise normal nondistended stomach. Normal caliber small bowel with no small bowel wall thickening. Normal appendix. Moderate sigmoid diverticulosis with no large bowel wall thickening or significant pericolonic fat stranding. Vascular/Lymphatic: Atherosclerotic nonaneurysmal abdominal aorta. No pathologically enlarged lymph nodes in the abdomen or pelvis. Reproductive: Grossly normal uterus.  No adnexal mass. Other: No pneumoperitoneum, ascites or focal  fluid collection. Musculoskeletal: No aggressive appearing focal osseous lesions. Marked lumbar spondylosis. VASCULAR MEASUREMENTS PERTINENT TO TAVR: AORTA: Minimal Aortic Diameter-11.6 x 11.2 mm Severity of Aortic Calcification-moderate RIGHT PELVIS: Right Common Iliac Artery - Minimal Diameter-8.0 x 7.0 mm Tortuosity-mild Calcification-moderate Right External Iliac Artery - Minimal Diameter-7.5 x 7.1 mm Tortuosity-mild Calcification-none Right Common Femoral Artery - Minimal Diameter-7.2 x 6.2 mm Tortuosity-mild Calcification-mild LEFT PELVIS: Left Common Iliac Artery - Minimal Diameter-8.6 x 7.8 mm Tortuosity-mild-to-moderate Calcification-mild Left External Iliac Artery - Minimal Diameter-7.5 x 7.4 mm Tortuosity-mild Calcification-mild Left Common Femoral Artery - Minimal Diameter-8.0 x 5.5 mm Tortuosity-mild Calcification-moderate Review of the MIP images confirms the above findings. IMPRESSION: 1. Vascular findings and measurements pertinent to potential TAVR procedure, as detailed. 2. Diffuse thickening and coarse calcification of the aortic valve, compatible with the reported history of severe aortic stenosis. 3. Mild cardiomegaly. 4. 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 No biliary or main pancreatic duct dilation. Recommend further evaluation with MRI abdomen  without and with IV contrast at this time. 5. Nonspecific 1.1 cm indistinct hypervascular focus in the right hepatic lobe, probably benign. This finding can also be further evaluated at MRI abdomen without and with IV contrast. 6. Mild splenomegaly. 7. Moderate sigmoid diverticulosis. 8. Small hiatal hernia. 9. Aortic Atherosclerosis (ICD10-I70.0). Electronically Signed   By: Ilona Sorrel M.D.   On: 08/17/2020 13:21   ECHOCARDIOGRAM LIMITED  Result Date: 09/07/2020    ECHOCARDIOGRAM LIMITED REPORT   Patient Name:   Mary Reed Date of Exam: 09/07/2020 Medical Rec #:  [id number]          Height:       69.0 in  Accession #:    [id number]         Weight:       181.9 lb Date of Birth:  1951-09-07         BSA:          1.984 m Patient Age:    84 years           BP:           188/70 mmHg Patient Gender: F                  HR:           81 bpm. Exam Location:  Inpatient Procedure: 2D Echo, Limited Color Doppler and Cardiac Doppler Indications:    Aortic Stenosis 424.1 / 135.0  History:        Patient has prior history of Echocardiogram examinations, most                 recent 06/14/2020. Risk Factors:Hypertension, Diabetes and Former                 Smoker. GERD.  Sonographer:    Vickie Epley RDCS Referring Phys: [id number] [place]  1. Periprocedural TTE during a TAVR procedure. Native aortic valve was functionally bicuspid with severely calcified leaflets. A 23 mm Edwards-SAPIEN 3 Ultra valve was successfully deployed in the aortic position with improvement of peak/mean transaortic gradients 72/45 mmHg to 16/7 mmHg with no paravalvular leak. No pericardial effusion was seen prior or post procedure.  2. Left ventricular ejection fraction, by estimation, is 55 to 60%. The left ventricle has normal function. There is moderate concentric left ventricular hypertrophy. Left ventricular diastolic function could not be evaluated.  3. Right ventricular systolic function is normal. The right ventricular size is normal.  4. Mild mitral valve regurgitation.  5. The aortic valve is bicuspid. There is severe calcifcation of the aortic valve. There is severe thickening of the aortic valve. Aortic valve regurgitation is not visualized. Severe aortic valve stenosis. Aortic valve mean gradient measures 45.0 mmHg. FINDINGS  Left Ventricle: Left ventricular ejection fraction, by estimation, is 55 to 60%. The left ventricle has normal function. There is moderate concentric left ventricular hypertrophy. Left ventricular diastolic function could not be evaluated. Right Ventricle: The right ventricular size is normal. Right  ventricular systolic function is normal. Pericardium: There is no evidence of pericardial effusion. Mitral Valve: Mild mitral valve regurgitation. Tricuspid Valve: The tricuspid valve is not assessed. Aortic Valve: The aortic valve is bicuspid. There is severe calcifcation of the aortic valve. There is severe thickening of the aortic valve. Aortic valve regurgitation is not visualized. Severe aortic stenosis is present. Aortic valve mean gradient measures 45.0 mmHg. Aortic valve peak gradient measures 37.8 mmHg. Aortic valve area, by VTI measures 1.55 cm.  LEFT VENTRICLE PLAX 2D LVOT diam:     2.30 cm LV SV:         110 LV SV Index:   55 LVOT Area:     4.15 cm  AORTIC VALVE AV Area (Vmax):    1.67 cm AV Area (Vmean):   1.51 cm AV Area (VTI):     1.55 cm AV Vmax:           307.25 cm/s AV Vmean:          219.250 cm/s AV VTI:            0.706 m AV Peak Grad:      37.8 mmHg AV Mean Grad:      45.0 mmHg LVOT Vmax:         123.45 cm/s LVOT Vmean:        79.750 cm/s LVOT VTI:          0.264 m LVOT/AV VTI ratio: 0.37  SHUNTS Systemic VTI:  0.26 m Systemic Diam: 2.30 cm Ena Dawley MD Electronically signed by Ena Dawley MD Signature Date/Time: 09/07/2020/5:53:34 PM    Final    Structural Heart Procedure  Result Date: 09/07/2020 See surgical note for result.  CT Angio Abd/Pel w/ and/or w/o  Result Date: 08/17/2020 CLINICAL DATA:  Severe aortic stenosis.  Pre-TAVR planning. EXAM: CT ANGIOGRAPHY CHEST, ABDOMEN AND PELVIS TECHNIQUE: Multidetector CT imaging through the chest, abdomen and pelvis was performed using the standard protocol during bolus administration of intravenous contrast. Multiplanar reconstructed images and MIPs were obtained and reviewed to evaluate the vascular anatomy. CONTRAST:  148mL OMNIPAQUE IOHEXOL 350 MG/ML SOLN COMPARISON:  11/01/2009 abdominal sonogram. FINDINGS: CTA CHEST FINDINGS Cardiovascular: Mild cardiomegaly. No significant pericardial effusion/thickening. Diffuse  thickening and coarse calcification of the aortic valve. Atherosclerotic nonaneurysmal thoracic aorta. Top-normal caliber main pulmonary artery (3.1 cm diameter). No central pulmonary emboli. Mediastinum/Nodes: Subcentimeter hypodense right thyroid nodule. Not clinically significant; no follow-up imaging recommended (ref: J Am Coll Radiol. 2015 Feb;12(2): 143-50). Unremarkable esophagus. No pathologically enlarged axillary, mediastinal or hilar lymph nodes. Lungs/Pleura: No pneumothorax. No pleural effusion. No acute consolidative airspace disease, lung masses or significant pulmonary nodules. Musculoskeletal: No aggressive appearing focal osseous lesions. Minimal thoracic spondylosis. CTA ABDOMEN AND PELVIS FINDINGS Hepatobiliary: Normal liver size. Nonspecific 1.1 cm indistinct hypervascular focus in the right lower lobe (series 7/image 104). Otherwise no liver lesions. Normal gallbladder with no radiopaque cholelithiasis. No biliary ductal dilatation. Pancreas: Suggestion of a complex 1.7 cm cystic multilocular pancreatic lesion in the uncinate process (series 7/image 129). No additional pancreatic lesions. No main pancreatic duct dilation. Spleen: Mild splenomegaly. Craniocaudal splenic length 13.2 cm. No splenic mass. Adrenals/Urinary Tract: Subcentimeter hypodense renal cortical lesion in the anterior upper left kidney is too small to characterize and requires no follow-up. No additional contour deforming renal lesions. No hydronephrosis. No contour deforming renal masses. Normal bladder. Stomach/Bowel: Small hiatal hernia. Otherwise normal nondistended stomach. Normal caliber small bowel with no small bowel wall thickening. Normal appendix. Moderate sigmoid diverticulosis with no large bowel wall thickening or significant pericolonic fat stranding. Vascular/Lymphatic: Atherosclerotic nonaneurysmal abdominal aorta. No pathologically enlarged lymph nodes in the abdomen or pelvis. Reproductive: Grossly normal  uterus.  No adnexal mass. Other: No pneumoperitoneum, ascites or focal fluid collection. Musculoskeletal: No aggressive appearing focal osseous lesions. Marked lumbar spondylosis. VASCULAR MEASUREMENTS PERTINENT TO TAVR: AORTA: Minimal Aortic Diameter-11.6 x 11.2 mm Severity of Aortic Calcification-moderate RIGHT PELVIS: Right Common Iliac Artery - Minimal Diameter-8.0 x 7.0 mm Tortuosity-mild Calcification-moderate Right  External Iliac Artery - Minimal Diameter-7.5 x 7.1 mm Tortuosity-mild Calcification-none Right Common Femoral Artery - Minimal Diameter-7.2 x 6.2 mm Tortuosity-mild Calcification-mild LEFT PELVIS: Left Common Iliac Artery - Minimal Diameter-8.6 x 7.8 mm Tortuosity-mild-to-moderate Calcification-mild Left External Iliac Artery - Minimal Diameter-7.5 x 7.4 mm Tortuosity-mild Calcification-mild Left Common Femoral Artery - Minimal Diameter-8.0 x 5.5 mm Tortuosity-mild Calcification-moderate Review of the MIP images confirms the above findings. IMPRESSION: 1. Vascular findings and measurements pertinent to potential TAVR procedure, as detailed. 2. Diffuse thickening and coarse calcification of the aortic valve, compatible with the reported history of severe aortic stenosis. 3. Mild cardiomegaly. 4. 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 No biliary or main pancreatic duct dilation. Recommend further evaluation with MRI abdomen without and with IV contrast at this time. 5. Nonspecific 1.1 cm indistinct hypervascular focus in the right hepatic lobe, probably benign. This finding can also be further evaluated at MRI abdomen without and with IV contrast. 6. Mild splenomegaly. 7. Moderate sigmoid diverticulosis. 8. Small hiatal hernia. 9. Aortic Atherosclerosis (ICD10-I70.0). Electronically Signed   By: Ilona Sorrel M.D.   On: 08/17/2020 13:21   Disposition   Pt is being discharged home today in good condition.  Follow-up Plans & Appointments     Follow-up Information     Eileen Stanford, PA-C. Go on 09/15/2020.   Specialties: Cardiology, Radiology Why: @ 1:30pm, please arrive at least 10 minutes early.  Contact information: [place] [id number] [phone]              Discharge Instructions    Amb Referral to Cardiac Rehabilitation   Complete by: As directed    Diagnosis: Valve Replacement   Valve: Aortic Comment - TAVR   After initial evaluation and assessments completed: Virtual Based Care may be provided alone or in conjunction with Phase 2 Cardiac Rehab based on patient barriers.: Yes   Diet - low sodium heart healthy   Complete by: As directed    Discharge instructions   Complete by: As directed    ACTIVITY AND EXERCISE  Daily activity and exercise are an important part of your recovery. People recover at different rates depending on their general health and type of valve procedure.  Most people require six to 10 weeks to feel recovered.  No lifting, pushing, pulling more than 10 pounds (examples to avoid: groceries, vacuuming, gardening, golfing):  - For one week with a procedure through the groin.  - For six weeks for procedures through the chest wall.  - For three months for procedures through the breast-bone. NOTE: You will typically see one of our providers 7-10 days after your procedure to discuss [place] the above activities.    DRIVING  Do not drive for four weeks after the date of your procedure.  If you have been told by your doctor in the past that you may not drive, you must talk with him/her before you begin driving again.  When you resume driving, you must have someone with you.   DRESSING  Groin site: you may leave the clear dressing over the site for up to one week or until it falls off.   HYGIENE  If you had a femoral (leg) procedure, you may take a shower when you return home. After the shower, pat the site dry. Do NOT use powder, oils or lotions in your groin area  until the site has completely healed.  If you had a chest procedure,  you may shower when you return home unless specifically instructed not to by your discharging practitioner.  - DO NOT scrub incision; pat dry with a towel  - DO NOT apply any lotions, oils, powders to the incision  - No tub baths / swimming for at least six weeks.  If you notice any fevers, chills, increased pain, swelling, bleeding or pus, please contact your doctor.  ADDITIONAL INFORMATION  If you are going to have an upcoming dental procedure, please contact our office as you may require antibiotics ahead of time to prevent infection on your heart valve.   Increase activity slowly   Complete by: As directed    Remove dressing in 24 hours   Complete by: As directed       Discharge Medications   Allergies as of 09/08/2020      Reactions   Codeine Nausea And Vomiting      Medication List    TAKE these medications   acetaminophen 650 MG CR tablet Commonly known as: TYLENOL Take 1,300 mg by mouth every 8 (eight) hours as needed for pain.   amLODipine 10 MG tablet Commonly known as: NORVASC Take 10 mg by mouth daily.   aspirin EC 81 MG tablet Take 81 mg by mouth daily. Swallow whole.   atenolol 50 MG tablet Commonly known as: TENORMIN Take 1 tablet (50 mg total) by mouth daily.   Biotin 5000 5 MG Caps Generic drug: Biotin Take 5 mg by mouth daily.   clopidogrel 75 MG tablet Commonly known as: PLAVIX Take 1 tablet (75 mg total) by mouth daily with breakfast. Start taking on: September 09, 2020   Dialyvite Vitamin D 5000 125 MCG (5000 UT) capsule Generic drug: Cholecalciferol Take 5,000 Units by mouth daily.   Easy Touch Safety Lancets 28G Misc USE TO CHECK BLOOD SUGAR EVERY DAY FOR 90 DAYS AS DIRECTED   etodolac 500 MG tablet Commonly known as: LODINE Take 500 mg by mouth every evening.   ferrous sulfate 325 (65 FE) MG tablet Take 325 mg by mouth daily.   ONE TOUCH ULTRA TEST test  strip Generic drug: glucose blood 1 each by Other route 2 (two) times a week.   Ozempic (0.25 or 0.5 MG/DOSE) 2 MG/1.5ML Sopn Generic drug: Semaglutide(0.25 or 0.5MG /DOS) Inject 0.5 mg into the skin once a week.   pantoprazole 40 MG tablet Commonly known as: PROTONIX Take 40 mg by mouth daily.   rosuvastatin 20 MG tablet Commonly known as: CRESTOR Take 10 mg by mouth daily.   sertraline 100 MG tablet Commonly known as: ZOLOFT Take 100 mg by mouth daily.   sodium chloride 0.65 % Soln nasal spray Commonly known as: OCEAN Place 1 spray into both nostrils daily.   telmisartan 80 MG tablet Commonly known as: MICARDIS Take 80 mg by mouth daily.         Outstanding Labs/Studies   Outpt echo  Duration of Discharge Encounter   Greater than 30 minutes including physician time.  Signed, Reino Bellis, NP 09/08/2020, 1:18 PM

## 2020-09-08 NOTE — Progress Notes (Signed)
Progress Note  Patient Name: Mary Reed Date of Encounter: 09/08/2020  [place] Cardiologist: No primary care provider on file.   Subjective   No chest pain or shortness of breath.  Complaining of a mild headache this morning.  Complaining of lower back pain from lying down.  Inpatient Medications    Scheduled Meds: . amLODipine  10 mg Oral Daily  . aspirin EC  81 mg Oral Daily  . clopidogrel  75 mg Oral Q breakfast  . ferrous sulfate  325 mg Oral Q breakfast  . irbesartan  300 mg Oral Daily  . pantoprazole  40 mg Oral Daily  . rosuvastatin  10 mg Oral Daily  . sertraline  100 mg Oral Daily  . sodium chloride flush  3 mL Intravenous Q12H   Continuous Infusions: . sodium chloride    . cefUROXime (ZINACEF)  IV Stopped (09/08/20 0010)  . nitroGLYCERIN    . phenylephrine (NEO-SYNEPHRINE) Adult infusion     PRN Meds: sodium chloride, acetaminophen **OR** acetaminophen, ondansetron (ZOFRAN) IV, sodium chloride flush   Vital Signs    Vitals:   09/07/20 2342 09/08/20 0510 09/08/20 0700 09/08/20 0804  BP: 124/71 (!) 146/61  (!) 133/53  Pulse: 67 72  76  Resp: 14 20  20   Temp: 99.3 F (37.4 C) 99.8 F (37.7 C) 99.9 F (37.7 C) 99.3 F (37.4 C)  TempSrc: Oral Oral Oral Oral  SpO2: 93% 98%  99%  Weight:  80.3 kg    Height:        Intake/Output Summary (Last 24 hours) at 09/08/2020 0907 Last data filed at 09/08/2020 0200 Gross per 24 hour  Intake 1887.62 ml  Output 800 ml  Net 1087.62 ml   Last 3 Weights 09/08/2020 09/07/2020 09/03/2020  Weight (lbs) 177 lb 1.6 oz 181 lb 14.1 oz 181 lb 14.4 oz  Weight (kg) 80.332 kg 82.5 kg 82.509 kg      Telemetry    Sinus rhythm, short runs of narrow complex tachycardia noted.  No high-grade AV block - Personally Reviewed  ECG    Normal sinus rhythm without significant abnormality - Personally Reviewed Yesterday's EKG reviewed with left bundle branch block and first-degree AV block (QRS 152 ms, PR 218 ms).   Today's EKG shows improvement in conduction with a normal QRS duration of 98 ms and PR interval of 174 ms.  Physical Exam  Alert, oriented, no distress GEN: No acute distress.   Neck: No JVD Cardiac: RRR, 2/6 systolic murmur at the right upper sternal border, no diastolic murmur. Respiratory: Clear to auscultation bilaterally. GI: Soft, nontender, non-distended  MS: No edema; No deformity.  Bilateral groin sites are clear Neuro:  Nonfocal  Psych: Normal affect   Labs    High Sensitivity Troponin:  No results for input(s): TROPONINIHS in the last 720 hours.    Chemistry Recent Labs  Lab 09/03/20 1103 09/03/20 1103 09/07/20 1143 09/07/20 1355 09/08/20 0241  NA 138   < > 144 143 139  K 3.6   < > 3.2* 3.1* 3.4*  CL 107   < > 106 106 106  CO2 21*  --   --   --  23  GLUCOSE 108*   < > 105* 136* 123*  BUN 7*   < > 7* 7* 6*  CREATININE 0.64   < > 0.50 0.60 0.65  CALCIUM 8.6*  --   --   --  8.6*  PROT 7.0  --   --   --   --  ALBUMIN 3.8  --   --   --   --   AST 16  --   --   --   --   ALT 15  --   --   --   --   ALKPHOS 84  --   --   --   --   BILITOT 0.7  --   --   --   --   GFRNONAA >60  --   --   --  >60  ANIONGAP 10  --   --   --  10   < > = values in this interval not displayed.     Hematology Recent Labs  Lab 09/03/20 1103 09/03/20 1103 09/07/20 1143 09/07/20 1355 09/08/20 0241  WBC 6.7  --   --   --  7.9  RBC 4.40  --   --   --  4.30  HGB 12.3   < > 12.2 11.2* 11.9*  HCT 37.7   < > 36.0 33.0* 36.0  MCV 85.7  --   --   --  83.7  MCH 28.0  --   --   --  27.7  MCHC 32.6  --   --   --  33.1  RDW 13.3  --   --   --  13.2  PLT 182  --   --   --  140*   < > = values in this interval not displayed.    BNP Recent Labs  Lab 09/03/20 1103  BNP 287.0*     DDimer No results for input(s): DDIMER in the last 168 hours.   Radiology    ECHOCARDIOGRAM LIMITED  Result Date: 09/07/2020    ECHOCARDIOGRAM LIMITED REPORT   Patient Name:   ROLANDO WHITBY Date  of Exam: 09/07/2020 Medical Rec #:  [id number]          Height:       69.0 in Accession #:    [id number]         Weight:       181.9 lb Date of Birth:  05-06-1951         BSA:          1.984 m Patient Age:    34 years           BP:           188/70 mmHg Patient Gender: F                  HR:           81 bpm. Exam Location:  Inpatient Procedure: 2D Echo, Limited Color Doppler and Cardiac Doppler Indications:    Aortic Stenosis 424.1 / 135.0  History:        Patient has prior history of Echocardiogram examinations, most                 recent 06/14/2020. Risk Factors:Hypertension, Diabetes and Former                 Smoker. GERD.  Sonographer:    Vickie Epley RDCS Referring Phys: [id number] [place]  1. Periprocedural TTE during a TAVR procedure. Native aortic valve was functionally bicuspid with severely calcified leaflets. A 23 mm Edwards-SAPIEN 3 Ultra valve was successfully deployed in the aortic position with improvement of peak/mean transaortic gradients 72/45 mmHg to 16/7 mmHg with no paravalvular leak. No pericardial effusion was seen prior or post procedure.  2. Left  ventricular ejection fraction, by estimation, is 55 to 60%. The left ventricle has normal function. There is moderate concentric left ventricular hypertrophy. Left ventricular diastolic function could not be evaluated.  3. Right ventricular systolic function is normal. The right ventricular size is normal.  4. Mild mitral valve regurgitation.  5. The aortic valve is bicuspid. There is severe calcifcation of the aortic valve. There is severe thickening of the aortic valve. Aortic valve regurgitation is not visualized. Severe aortic valve stenosis. Aortic valve mean gradient measures 45.0 mmHg. FINDINGS  Left Ventricle: Left ventricular ejection fraction, by estimation, is 55 to 60%. The left ventricle has normal function. There is moderate concentric left ventricular hypertrophy. Left ventricular diastolic function could not be  evaluated. Right Ventricle: The right ventricular size is normal. Right ventricular systolic function is normal. Pericardium: There is no evidence of pericardial effusion. Mitral Valve: Mild mitral valve regurgitation. Tricuspid Valve: The tricuspid valve is not assessed. Aortic Valve: The aortic valve is bicuspid. There is severe calcifcation of the aortic valve. There is severe thickening of the aortic valve. Aortic valve regurgitation is not visualized. Severe aortic stenosis is present. Aortic valve mean gradient measures 45.0 mmHg. Aortic valve peak gradient measures 37.8 mmHg. Aortic valve area, by VTI measures 1.55 cm. LEFT VENTRICLE PLAX 2D LVOT diam:     2.30 cm LV SV:         110 LV SV Index:   55 LVOT Area:     4.15 cm  AORTIC VALVE AV Area (Vmax):    1.67 cm AV Area (Vmean):   1.51 cm AV Area (VTI):     1.55 cm AV Vmax:           307.25 cm/s AV Vmean:          219.250 cm/s AV VTI:            0.706 m AV Peak Grad:      37.8 mmHg AV Mean Grad:      45.0 mmHg LVOT Vmax:         123.45 cm/s LVOT Vmean:        79.750 cm/s LVOT VTI:          0.264 m LVOT/AV VTI ratio: 0.37  SHUNTS Systemic VTI:  0.26 m Systemic Diam: 2.30 cm Ena Dawley MD Electronically signed by Ena Dawley MD Signature Date/Time: 09/07/2020/5:53:34 PM    Final    Structural Heart Procedure  Result Date: 09/07/2020 See surgical note for result.   Cardiac Studies   2D echocardiogram pending  Patient Profile     69 y.o. female with severe bicuspid aortic stenosis, presenting 09/07/2020 for TAVR  Assessment & Plan    1.  Severe aortic stenosis status post TAVR: Continue aspirin 81 mg daily.  Add clopidogrel 75 mg daily x6 months.  Lifelong SBE prophylaxis per guidelines.  Await 2D echocardiogram.  Clinically stable for discharge home today.  Reviewed post TAVR instructions with the patient who understands. 2.  Hypertension: Resume home antihypertensive medical therapy.  Heart rhythm appears stable will resume  atenolol today.  Disposition: Medically stable for discharge home today.  Postoperative labs, vital signs, telemetry, and EKG reviewed.  Resume home medical therapy.  Patient transiently had a left bundle branch block on her postprocedural EKG, but her QRS and PR intervals have both normalized based on my review of telemetry and her EKG this morning.  Add clopidogrel 75 mg daily x6 months.  Hospital follow-up is arranged.  For questions or  updates, please contact [place] Please consult [email or web address] for contact info under        Signed, Sherren Mocha, MD  09/08/2020, 9:07 AM

## 2020-09-08 NOTE — Progress Notes (Signed)
  Echocardiogram 2D Echocardiogram has been performed.  Mary Reed [name] 09/08/2020, 12:30 PM

## 2020-09-08 NOTE — Progress Notes (Signed)
[id number] Offered to walk with pt but she declined at this time due to back pain. Pt normally walks her dog three or four times a day. Discussed resuming walking for exercise. Pt very interested in CRP 2 so will refer to [place] program. Gave diabetic and heart healthy diets and encouraged low sodium.  Pt voiced understanding. Graylon Good RN BSN 09/08/2020 10:47 AM

## 2020-09-08 NOTE — Progress Notes (Signed)
Despite education, pt refused ambulation this AM endorsing walking to bathroom was sufficient. Will convey to Day RN.

## 2020-09-09 ENCOUNTER — Telehealth: Payer: Self-pay

## 2020-09-09 NOTE — Telephone Encounter (Signed)
Patient contacted regarding discharge from [facility] on 09/08/2020.  Patient understands to follow up with provider Nell Range PA-C on 09/15/2020 at 1:30 PM at [facility] office. Patient understands discharge instructions? yes Patient understands medications and regiment? yes Patient understands to bring all medications to this visit? Yes  The pt is feeling well today and is currently walking her dogs.  I advised her to please be cautious and not over exert herself.  I reviewed post TAVR DC instructions over the phone and instructed her to contact the office with any additional questions or concerns. Pt verbalized understanding.
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
## 2020-09-15 ENCOUNTER — Other Ambulatory Visit: Payer: Self-pay | Admitting: Physician Assistant

## 2020-09-15 ENCOUNTER — Ambulatory Visit (INDEPENDENT_AMBULATORY_CARE_PROVIDER_SITE_OTHER): Payer: Medicare HMO

## 2020-09-15 ENCOUNTER — Other Ambulatory Visit: Payer: Self-pay

## 2020-09-15 ENCOUNTER — Encounter: Payer: Self-pay | Admitting: Physician Assistant

## 2020-09-15 ENCOUNTER — Ambulatory Visit: Payer: Medicare HMO | Admitting: Physician Assistant

## 2020-09-15 VITALS — BP 140/68 | HR 62 | Ht 69.0 in | Wt 179.4 lb

## 2020-09-15 DIAGNOSIS — I6523 Occlusion and stenosis of bilateral carotid arteries: Secondary | ICD-10-CM

## 2020-09-15 DIAGNOSIS — I440 Atrioventricular block, first degree: Secondary | ICD-10-CM | POA: Diagnosis not present

## 2020-09-15 DIAGNOSIS — K869 Disease of pancreas, unspecified: Secondary | ICD-10-CM | POA: Diagnosis not present

## 2020-09-15 DIAGNOSIS — Z952 Presence of prosthetic heart valve: Secondary | ICD-10-CM

## 2020-09-15 DIAGNOSIS — I447 Left bundle-branch block, unspecified: Secondary | ICD-10-CM

## 2020-09-15 DIAGNOSIS — I10 Essential (primary) hypertension: Secondary | ICD-10-CM | POA: Diagnosis not present

## 2020-09-15 MED ORDER — AMOXICILLIN 500 MG PO CAPS: ORAL_CAPSULE | ORAL | 2 refills | Status: DC

## 2020-09-15 NOTE — Patient Instructions (Signed)
Medication Instructions:  Your physician has recommended you make the following change in your medication:  1.) stop Plavix (clopidogrel)  On April 19 (when your pills run out) 2.) amoxicillin 500 mg tablets --take 2000 mg by mouth 60 min prior to any dental procedures  *If you need a refill on your cardiac medications before your next appointment, please call your pharmacy*   Lab Work: none If you have labs (blood work) drawn today and your tests are completely normal, you will receive your results only by: Marland Kitchen MyChart Message (if you have MyChart) OR . A paper copy in the mail If you have any lab test that is abnormal or we need to change your treatment, we will call you to review the results.   Testing/Procedures: ZIO AT Long term monitor-Live Telemetry  Your physician has requested you wear a ZIO patch monitor for __ days.  This is a single patch monitor. [employer] supplies one patch monitor per enrollment. Additional stickers are not available.  Please do not apply patch if you will be having a Nuclear Stress Test, Echocardiogram, Cardiac CT, MRI, or Chest Xray during the time frame you would be wearing the monitor. The patch cannot be worn during these tests. You cannot remove and re-apply the ZIO AT patch monitor.   Your ZIO patch monitor will be sent Fed Ex from [employer] directly to your home address. The monitor may also be mailed to a PO BOX if home delivery is not available. It may take 3-5 days to receive your monitor after you have been enrolled.  Once you have received you monitor, please review enclosed instructions. Your monitor has already been registered assigning a specific monitor serial # to you.   Applying the monitor  Shave hair from upper left chest.  Hold abrader disc by orange tab. Rub abrader in 40 strokes over left upper chest as indicated in your monitor instructions.  Clean area with 4 enclosed alcohol pads. Use all pads to ensure the area is cleaned  thoroughly. Let dry.  Apply patch as indicated in monitor instructions. Patch will be placed under collarbone on left side of chest with arrow pointing upward.  Rub patch adhesive wings for 2 minutes. Remove the white label marked "1". Remove the white label marked "2". Rub patch adhesive wings for 2 additional minutes.  While looking in a mirror, press and release button in center of patch. A small green light will flash 3-4 times. This will be your only indicator the monitor has been turned on.  Do not shower for the first 24 hours. You may shower after the first 24 hours.  Press the button if you feel a symptom. You will hear a small click. Record Date, Time and Symptom in the Patient Log.   Starting the Gateway  In your kit there is a Hydrographic surveyor box the size of a cellphone. This is Airline pilot. It transmits all your recorded data to [facility]. This box must stay within 10 feet of you at all times. Open the box and push the * button. There will be a light that blinks orange and then green a few times. When the light stops blinking, the Gateway is connected to the ZIO patch.  Call [employer] at [phone] to confirm your monitor is transmitting.   Returning your monitor  Remove your patch and place it inside the [place]. In the lower half of the Gateway there is a white bag with prepaid postage on it. Place Gateway  in bag and seal. Mail package back to [facility] as soon as possible. Your physician should have your final report approximately 7 days after you have mailed back your monitor.   Call [place] at [phone] if you have questions regarding your ZIO AT patch monitor. Call them immediately if you see an orange light blinking on your monitor.  If your monitor falls off in less than 4 days contact our Monitor department at [phone]. If your monitor becomes loose or falls off after 4 days call [employer] at [phone] for suggestions on securing your monitor.     Follow-Up: As scheduled with Dr. Einar Gip.  Other Instructions

## 2020-09-16 DIAGNOSIS — I447 Left bundle-branch block, unspecified: Secondary | ICD-10-CM | POA: Diagnosis not present

## 2020-09-16 DIAGNOSIS — Z952 Presence of prosthetic heart valve: Secondary | ICD-10-CM | POA: Diagnosis not present

## 2020-09-16 NOTE — Addendum Note (Signed)
Addended by: Angelena Form R on: 09/16/2020 11:15 AM   Modules accepted: Level of Service

## 2020-09-17 ENCOUNTER — Encounter: Payer: Self-pay | Admitting: Cardiology

## 2020-09-17 DIAGNOSIS — Z952 Presence of prosthetic heart valve: Secondary | ICD-10-CM | POA: Insufficient documentation

## 2020-09-18 NOTE — Telephone Encounter (Signed)
Post TAVR visit set

## 2020-09-22 ENCOUNTER — Ambulatory Visit: Payer: Medicare HMO | Admitting: Cardiology

## 2020-09-28 ENCOUNTER — Telehealth (HOSPITAL_COMMUNITY): Payer: Self-pay

## 2020-09-28 NOTE — Telephone Encounter (Signed)
Called patient to see if she is interested in the Cardiac Rehab Program. Patient expressed interest. Explained scheduling process and went over insurance, patient verbalized understanding. Will contact patient for scheduling once f/u has been completed. 

## 2020-09-28 NOTE — Telephone Encounter (Signed)
Pt insurance is active and benefits verified through [facility]. Co-pay $45.00, DED $0.00/$0.00 met, out of pocket $5,000.00/$1,355.91 met, co-insurance 0%. No pre-authorization required. Randy/[employer], 09/28/20 @ 943AM, VEL#[id number]  Will contact patient to see if she is interested in the Cardiac Rehab Program. If interested, patient will need to complete follow up appt. Once completed, patient will be contacted for scheduling upon review by the RN Navigator.

## 2020-10-04 ENCOUNTER — Other Ambulatory Visit: Payer: Self-pay

## 2020-10-04 ENCOUNTER — Ambulatory Visit: Payer: Medicare HMO

## 2020-10-04 DIAGNOSIS — I359 Nonrheumatic aortic valve disorder, unspecified: Secondary | ICD-10-CM | POA: Diagnosis not present

## 2020-10-04 DIAGNOSIS — Z952 Presence of prosthetic heart valve: Secondary | ICD-10-CM | POA: Diagnosis not present

## 2020-10-12 ENCOUNTER — Other Ambulatory Visit: Payer: Self-pay

## 2020-10-12 ENCOUNTER — Encounter: Payer: Self-pay | Admitting: Cardiology

## 2020-10-12 ENCOUNTER — Ambulatory Visit: Payer: Medicare HMO | Admitting: Cardiology

## 2020-10-12 VITALS — BP 144/76 | HR 57 | Ht 69.0 in | Wt 183.0 lb

## 2020-10-12 DIAGNOSIS — Z952 Presence of prosthetic heart valve: Secondary | ICD-10-CM

## 2020-10-12 DIAGNOSIS — I10 Essential (primary) hypertension: Secondary | ICD-10-CM

## 2020-10-12 DIAGNOSIS — I6523 Occlusion and stenosis of bilateral carotid arteries: Secondary | ICD-10-CM

## 2020-10-12 DIAGNOSIS — R0600 Dyspnea, unspecified: Secondary | ICD-10-CM

## 2020-10-12 DIAGNOSIS — R0609 Other forms of dyspnea: Secondary | ICD-10-CM

## 2020-10-12 MED ORDER — SPIRONOLACTONE 25 MG PO TABS: 25.0000 mg | ORAL_TABLET | ORAL | 2 refills | Status: DC
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
## 2020-10-18 DIAGNOSIS — I10 Essential (primary) hypertension: Secondary | ICD-10-CM | POA: Diagnosis not present

## 2020-10-18 NOTE — Progress Notes (Signed)
Zio Patch Extended out patient EKG monitoring 14 days 09/15/2020: 1. The basic rhythm is normal sinus with an average HR of 63 bpm 2. No atrial fibrillation or flutter 3. No high-grade heart block or pathologic pauses 4. There are rare PVC's (<1%) and one 4 beat ventricular run 5. There are occasional episodes of SVT noted, the longest lasting 3 minutes and 52 seconds with an average HR of 121 bpm (possible atrial tachycardia) 6.          Intermittent LBBB (rate related?)  M. Burt Knack, [place] with above

## 2020-10-19 LAB — BASIC METABOLIC PANEL
BUN/Creatinine Ratio: 15 (ref 12–28)
BUN: 13 mg/dL (ref 8–27)
CO2: 22 mmol/L (ref 20–29)
Calcium: 9.2 mg/dL (ref 8.7–10.3)
Chloride: 100 mmol/L (ref 96–106)
Creatinine, Ser: 0.85 mg/dL (ref 0.57–1.00)
GFR calc Af Amer: 80 mL/min/{1.73_m2} (ref >59)
GFR calc non Af Amer: 69 mL/min/{1.73_m2} (ref >59)
Glucose: 115 mg/dL — ABNORMAL HIGH (ref 65–99)
Potassium: 4.3 mmol/L (ref 3.5–5.2)
Sodium: 137 mmol/L (ref 134–144)

## 2020-10-19 LAB — CBC
Hematocrit: 39.2 % (ref 34.0–46.6)
Hemoglobin: 13.1 g/dL (ref 11.1–15.9)
MCH: 27.6 pg (ref 26.6–33.0)
MCHC: 33.4 g/dL (ref 31.5–35.7)
MCV: 83 fL (ref 79–97)
Platelets: 192 10*3/uL (ref 150–450)
RBC: 4.75 x10E6/uL (ref 3.77–5.28)
RDW: 13.5 % (ref 11.7–15.4)
WBC: 7.9 10*3/uL (ref 3.4–10.8)

## 2020-10-19 MED ORDER — SPIRONOLACTONE 50 MG PO TABS: 50.0000 mg | ORAL_TABLET | ORAL | 2 refills | Status: DC

## 2020-10-19 NOTE — Addendum Note (Signed)
Addended by: Kela Millin on: 10/19/2020 10:26 AM   Modules accepted: Orders

## 2020-10-20 ENCOUNTER — Encounter (HOSPITAL_COMMUNITY): Payer: Self-pay | Admitting: *Deleted

## 2020-10-20 DIAGNOSIS — Z952 Presence of prosthetic heart valve: Secondary | ICD-10-CM

## 2020-10-20 NOTE — Progress Notes (Signed)
Chart reviewed. Patient appropriate to schedule for phase 2 cardiac rehab. Will forward to scheduling staff.Barnet Pall, RN,BSN 10/20/2020 2:49 PM

## 2020-10-22 DIAGNOSIS — E7800 Pure hypercholesterolemia, unspecified: Secondary | ICD-10-CM | POA: Diagnosis not present

## 2020-10-22 DIAGNOSIS — D509 Iron deficiency anemia, unspecified: Secondary | ICD-10-CM | POA: Diagnosis not present

## 2020-10-22 DIAGNOSIS — I10 Essential (primary) hypertension: Secondary | ICD-10-CM | POA: Diagnosis not present

## 2020-10-22 DIAGNOSIS — E559 Vitamin D deficiency, unspecified: Secondary | ICD-10-CM | POA: Diagnosis not present

## 2020-10-22 DIAGNOSIS — E119 Type 2 diabetes mellitus without complications: Secondary | ICD-10-CM | POA: Diagnosis not present

## 2020-10-27 DIAGNOSIS — E559 Vitamin D deficiency, unspecified: Secondary | ICD-10-CM | POA: Diagnosis not present

## 2020-10-27 DIAGNOSIS — E118 Type 2 diabetes mellitus with unspecified complications: Secondary | ICD-10-CM | POA: Diagnosis not present

## 2020-10-27 DIAGNOSIS — Z7902 Long term (current) use of antithrombotics/antiplatelets: Secondary | ICD-10-CM | POA: Diagnosis not present

## 2020-10-27 DIAGNOSIS — I10 Essential (primary) hypertension: Secondary | ICD-10-CM | POA: Diagnosis not present

## 2020-10-27 DIAGNOSIS — E7800 Pure hypercholesterolemia, unspecified: Secondary | ICD-10-CM | POA: Diagnosis not present

## 2020-10-27 DIAGNOSIS — Z952 Presence of prosthetic heart valve: Secondary | ICD-10-CM | POA: Diagnosis not present

## 2020-10-27 DIAGNOSIS — D649 Anemia, unspecified: Secondary | ICD-10-CM | POA: Diagnosis not present

## 2020-10-27 DIAGNOSIS — I6529 Occlusion and stenosis of unspecified carotid artery: Secondary | ICD-10-CM | POA: Diagnosis not present

## 2020-11-01 ENCOUNTER — Encounter (HOSPITAL_COMMUNITY): Payer: Self-pay

## 2020-11-01 ENCOUNTER — Telehealth (HOSPITAL_COMMUNITY): Payer: Self-pay

## 2020-11-01 NOTE — Telephone Encounter (Signed)
Attempted to call patient in regards to Cardiac Rehab - LM on VM Mailed letter 

## 2020-11-16 ENCOUNTER — Telehealth (HOSPITAL_COMMUNITY): Payer: Self-pay

## 2020-11-16 NOTE — Telephone Encounter (Signed)
No response from pt.  Closed referral  

## 2020-11-17 ENCOUNTER — Other Ambulatory Visit (HOSPITAL_COMMUNITY)
Admission: RE | Admit: 2020-11-17 | Discharge: 2020-11-17 | Disposition: A | Discharge status: Home / Self Care | Payer: Medicare HMO | Source: Ambulatory Visit | Attending: Obstetrics and Gynecology | Admitting: Obstetrics and Gynecology

## 2020-11-17 ENCOUNTER — Encounter: Payer: Self-pay | Admitting: Obstetrics and Gynecology

## 2020-11-17 ENCOUNTER — Ambulatory Visit: Payer: Medicare HMO | Admitting: Obstetrics and Gynecology

## 2020-11-17 ENCOUNTER — Other Ambulatory Visit: Payer: Self-pay

## 2020-11-17 VITALS — BP 136/70 | HR 56 | Ht 68.0 in | Wt 185.0 lb

## 2020-11-17 DIAGNOSIS — Z9189 Other specified personal risk factors, not elsewhere classified: Secondary | ICD-10-CM | POA: Diagnosis not present

## 2020-11-17 DIAGNOSIS — B009 Herpesviral infection, unspecified: Secondary | ICD-10-CM | POA: Diagnosis not present

## 2020-11-17 DIAGNOSIS — Z1239 Encounter for other screening for malignant neoplasm of breast: Secondary | ICD-10-CM | POA: Diagnosis not present

## 2020-11-17 DIAGNOSIS — Z124 Encounter for screening for malignant neoplasm of cervix: Secondary | ICD-10-CM

## 2020-11-17 MED ORDER — NYSTATIN 100000 UNIT/GM EX POWD: 1.0000 "application " | Freq: Three times a day (TID) | CUTANEOUS | 2 refills | Status: DC

## 2020-11-17 MED ORDER — VALACYCLOVIR HCL 500 MG PO TABS: 500.0000 mg | ORAL_TABLET | Freq: Two times a day (BID) | ORAL | 1 refills | Status: DC
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
## 2020-11-22 LAB — CYTOLOGY - PAP: Diagnosis: NEGATIVE

## 2020-11-24 DIAGNOSIS — J40 Bronchitis, not specified as acute or chronic: Secondary | ICD-10-CM | POA: Diagnosis not present

## 2020-11-29 ENCOUNTER — Ambulatory Visit: Payer: Medicare HMO | Admitting: Cardiology

## 2020-11-29 ENCOUNTER — Encounter: Payer: Self-pay | Admitting: Cardiology

## 2020-11-29 ENCOUNTER — Other Ambulatory Visit: Payer: Self-pay

## 2020-11-29 VITALS — BP 140/72 | HR 75 | Ht 68.0 in | Wt 182.0 lb

## 2020-11-29 DIAGNOSIS — I10 Essential (primary) hypertension: Secondary | ICD-10-CM

## 2020-11-29 DIAGNOSIS — I6523 Occlusion and stenosis of bilateral carotid arteries: Secondary | ICD-10-CM | POA: Diagnosis not present

## 2020-11-29 DIAGNOSIS — U071 COVID-19: Secondary | ICD-10-CM | POA: Diagnosis not present

## 2020-11-29 DIAGNOSIS — J208 Acute bronchitis due to other specified organisms: Secondary | ICD-10-CM | POA: Diagnosis not present

## 2020-11-29 DIAGNOSIS — J1282 Pneumonia due to coronavirus disease 2019: Secondary | ICD-10-CM

## 2020-11-29 DIAGNOSIS — Z952 Presence of prosthetic heart valve: Secondary | ICD-10-CM

## 2020-11-29 DIAGNOSIS — B9689 Other specified bacterial agents as the cause of diseases classified elsewhere: Secondary | ICD-10-CM | POA: Diagnosis not present

## 2020-11-29 MED ORDER — AZITHROMYCIN 250 MG PO TABS: ORAL_TABLET | ORAL | 0 refills | Status: DC
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
## 2020-12-19 ENCOUNTER — Other Ambulatory Visit: Payer: Self-pay | Admitting: Cardiology

## 2020-12-21 ENCOUNTER — Other Ambulatory Visit: Payer: Medicare HMO

## 2020-12-27 DIAGNOSIS — M47816 Spondylosis without myelopathy or radiculopathy, lumbar region: Secondary | ICD-10-CM | POA: Diagnosis not present

## 2020-12-27 DIAGNOSIS — M5416 Radiculopathy, lumbar region: Secondary | ICD-10-CM | POA: Diagnosis not present

## 2021-01-03 ENCOUNTER — Other Ambulatory Visit: Payer: Medicare HMO

## 2021-01-03 DIAGNOSIS — M5416 Radiculopathy, lumbar region: Secondary | ICD-10-CM | POA: Diagnosis not present

## 2021-01-03 DIAGNOSIS — M47816 Spondylosis without myelopathy or radiculopathy, lumbar region: Secondary | ICD-10-CM | POA: Diagnosis not present

## 2021-01-10 ENCOUNTER — Ambulatory Visit: Payer: Medicare HMO | Admitting: Cardiology

## 2021-01-13 ENCOUNTER — Other Ambulatory Visit: Payer: Self-pay

## 2021-01-13 ENCOUNTER — Ambulatory Visit: Payer: Medicare HMO

## 2021-01-13 DIAGNOSIS — I6523 Occlusion and stenosis of bilateral carotid arteries: Secondary | ICD-10-CM

## 2021-01-16 ENCOUNTER — Other Ambulatory Visit: Payer: Self-pay | Admitting: Cardiology

## 2021-01-16 DIAGNOSIS — I6523 Occlusion and stenosis of bilateral carotid arteries: Secondary | ICD-10-CM

## 2021-02-07 DIAGNOSIS — M47816 Spondylosis without myelopathy or radiculopathy, lumbar region: Secondary | ICD-10-CM | POA: Diagnosis not present

## 2021-02-07 DIAGNOSIS — M5416 Radiculopathy, lumbar region: Secondary | ICD-10-CM | POA: Diagnosis not present

## 2021-02-09 ENCOUNTER — Telehealth: Payer: Self-pay
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
## 2021-02-10 ENCOUNTER — Other Ambulatory Visit: Payer: Self-pay

## 2021-02-10 DIAGNOSIS — R600 Localized edema: Secondary | ICD-10-CM

## 2021-02-10 MED ORDER — FUROSEMIDE 20 MG PO TABS: 20.0000 mg | ORAL_TABLET | Freq: Every day | ORAL | 0 refills | Status: DC

## 2021-02-10 NOTE — Telephone Encounter (Signed)
Called and spoke to pt regarding leg edema. Sent in prescription for Lasix 20mg  once daily for 7 days per Dr.Ganji.

## 2021-02-15 ENCOUNTER — Other Ambulatory Visit: Payer: Self-pay

## 2021-02-15 MED ORDER — FUROSEMIDE 20 MG PO TABS: 20.0000 mg | ORAL_TABLET | Freq: Every day | ORAL | 0 refills | Status: DC

## 2021-02-23 DIAGNOSIS — E7800 Pure hypercholesterolemia, unspecified: Secondary | ICD-10-CM | POA: Diagnosis not present

## 2021-02-23 DIAGNOSIS — E559 Vitamin D deficiency, unspecified: Secondary | ICD-10-CM | POA: Diagnosis not present

## 2021-02-23 DIAGNOSIS — E118 Type 2 diabetes mellitus with unspecified complications: Secondary | ICD-10-CM | POA: Diagnosis not present

## 2021-02-23 DIAGNOSIS — I10 Essential (primary) hypertension: Secondary | ICD-10-CM | POA: Diagnosis not present

## 2021-02-23 DIAGNOSIS — R748 Abnormal levels of other serum enzymes: Secondary | ICD-10-CM | POA: Diagnosis not present

## 2021-02-28 ENCOUNTER — Ambulatory Visit: Payer: Medicare HMO | Admitting: Student

## 2021-02-28 ENCOUNTER — Ambulatory Visit: Payer: Medicare HMO | Admitting: Obstetrics and Gynecology

## 2021-02-28 ENCOUNTER — Other Ambulatory Visit: Payer: Self-pay

## 2021-02-28 ENCOUNTER — Encounter: Payer: Self-pay | Admitting: Student

## 2021-02-28 ENCOUNTER — Ambulatory Visit: Payer: Medicare HMO | Admitting: Cardiology

## 2021-02-28 VITALS — BP 123/56 | HR 65 | Temp 98.3°F | Ht 68.0 in | Wt 187.0 lb

## 2021-02-28 DIAGNOSIS — I6523 Occlusion and stenosis of bilateral carotid arteries: Secondary | ICD-10-CM | POA: Diagnosis not present

## 2021-02-28 DIAGNOSIS — I10 Essential (primary) hypertension: Secondary | ICD-10-CM

## 2021-02-28 MED ORDER — METOPROLOL SUCCINATE ER 50 MG PO TB24: 50.0000 mg | ORAL_TABLET | Freq: Every day | ORAL | 3 refills | Status: DC
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
## 2021-03-01 ENCOUNTER — Other Ambulatory Visit: Payer: Self-pay

## 2021-03-01 MED ORDER — FUROSEMIDE 20 MG PO TABS
20.0000 mg | ORAL_TABLET | ORAL | 1 refills | Status: DC | PRN
Start: 2021-03-01 — End: 2022-01-24

## 2021-03-02 DIAGNOSIS — E559 Vitamin D deficiency, unspecified: Secondary | ICD-10-CM | POA: Diagnosis not present

## 2021-03-02 DIAGNOSIS — Z952 Presence of prosthetic heart valve: Secondary | ICD-10-CM | POA: Diagnosis not present

## 2021-03-02 DIAGNOSIS — Z0000 Encounter for general adult medical examination without abnormal findings: Secondary | ICD-10-CM | POA: Diagnosis not present

## 2021-03-02 DIAGNOSIS — E1165 Type 2 diabetes mellitus with hyperglycemia: Secondary | ICD-10-CM | POA: Diagnosis not present

## 2021-03-02 DIAGNOSIS — E7800 Pure hypercholesterolemia, unspecified: Secondary | ICD-10-CM | POA: Diagnosis not present

## 2021-03-02 DIAGNOSIS — K219 Gastro-esophageal reflux disease without esophagitis: Secondary | ICD-10-CM | POA: Diagnosis not present

## 2021-03-02 DIAGNOSIS — I10 Essential (primary) hypertension: Secondary | ICD-10-CM | POA: Diagnosis not present

## 2021-03-02 NOTE — Progress Notes (Signed)
02/24/2021: Hemoglobin 13.0, hematocrit 38.9, platelet 177, MCV 83.7 Glucose 266, BUN 12, creatinine 0.74, sodium 138, potassium 4.8, alk phos 136, AST 11, ALT 13, GFR 86 A1c 7.4% Total cholesterol 180, triglycerides 225, HDL 42, LDL 100  10/22/2020: Sodium 144, glucose 168, BUN 13, creatinine 0.8, GFR 74.9, alk phos 137, potassium 4.6, AST 13, ALT 24

## 2021-03-06 ENCOUNTER — Other Ambulatory Visit: Payer: Self-pay | Admitting: Cardiology

## 2021-03-17 DIAGNOSIS — H25813 Combined forms of age-related cataract, bilateral: Secondary | ICD-10-CM | POA: Diagnosis not present

## 2021-03-17 DIAGNOSIS — H43813 Vitreous degeneration, bilateral: Secondary | ICD-10-CM | POA: Diagnosis not present

## 2021-03-17 DIAGNOSIS — H5203 Hypermetropia, bilateral: Secondary | ICD-10-CM | POA: Diagnosis not present

## 2021-03-17 DIAGNOSIS — E119 Type 2 diabetes mellitus without complications: Secondary | ICD-10-CM | POA: Diagnosis not present

## 2021-04-11 ENCOUNTER — Ambulatory Visit: Payer: Medicare HMO | Admitting: Student
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
## 2021-04-12 ENCOUNTER — Encounter: Payer: Self-pay | Admitting: Student

## 2021-04-12 ENCOUNTER — Other Ambulatory Visit: Payer: Self-pay

## 2021-04-12 ENCOUNTER — Ambulatory Visit: Payer: Medicare HMO | Admitting: Student

## 2021-04-12 VITALS — BP 111/66 | HR 70 | Temp 96.0°F | Resp 16 | Ht 68.0 in | Wt 181.6 lb

## 2021-04-12 DIAGNOSIS — Z952 Presence of prosthetic heart valve: Secondary | ICD-10-CM | POA: Diagnosis not present

## 2021-04-12 DIAGNOSIS — I10 Essential (primary) hypertension: Secondary | ICD-10-CM | POA: Diagnosis not present

## 2021-04-15 IMAGING — CT CT ANGIO CHEST
1 of 5 series · 1 of 28 positions shown · IV contrast (omnipaque)
Comparison: 11/01/2009 abdominal sonogram.

CLINICAL DATA: Severe aortic stenosis.  Pre-TAVR planning.

EXAM:
CT ANGIOGRAPHY CHEST, ABDOMEN AND PELVIS
TECHNIQUE: Multidetector CT imaging through the chest, abdomen and pelvis was
performed using the standard protocol during bolus administration of
intravenous contrast. Multiplanar reconstructed images and MIPs were
obtained and reviewed to evaluate the vascular anatomy.
CONTRAST:  100mL OMNIPAQUE IOHEXOL 350 MG/ML SOLN

[Series 459: — · 0.13mm/px · 1 of 6 slices shown]
[im 4/6]
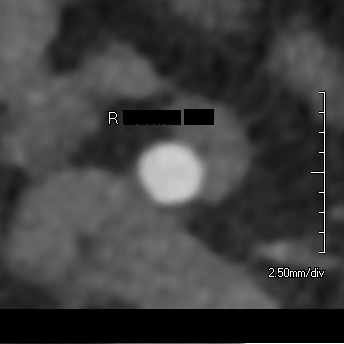

[1 of 28 positions shown; findings below may reference images not displayed]

FINDINGS: CTA CHEST FINDINGS

Cardiovascular: Mild cardiomegaly. No significant pericardial
effusion/thickening. Diffuse thickening and coarse calcification of
the aortic valve. Atherosclerotic nonaneurysmal thoracic aorta.
Top-normal caliber main pulmonary artery (3.1 cm diameter). No
central pulmonary emboli.

Mediastinum/Nodes: Subcentimeter hypodense right thyroid nodule. Not
clinically significant; no follow-up imaging recommended (ref: [HOSPITAL]. [DATE]): 143-50). Unremarkable esophagus. No
pathologically enlarged axillary, mediastinal or hilar lymph nodes.

Lungs/Pleura: No pneumothorax. No pleural effusion. No acute
consolidative airspace disease, lung masses or significant pulmonary
nodules.

Musculoskeletal: No aggressive appearing focal osseous lesions.
Minimal thoracic spondylosis.

CTA ABDOMEN AND PELVIS FINDINGS

Hepatobiliary: Normal liver size. Nonspecific 1.1 cm indistinct
hypervascular focus in the right lower lobe (series 7/image 104).
Otherwise no liver lesions. Normal gallbladder with no radiopaque
cholelithiasis. No biliary ductal dilatation.

Pancreas: Suggestion of a complex 1.7 cm cystic multilocular
pancreatic lesion in the uncinate process (series 7/image 129). No
additional pancreatic lesions. No main pancreatic duct dilation.

Spleen: Mild splenomegaly. Craniocaudal splenic length 13.2 cm. No
splenic mass.

Adrenals/Urinary Tract: Subcentimeter hypodense renal cortical
lesion in the anterior upper left kidney is too small to
characterize and requires no follow-up. No additional contour
deforming renal lesions. No hydronephrosis. No contour deforming
renal masses. Normal bladder.

Stomach/Bowel: Small hiatal hernia. Otherwise normal nondistended
stomach. Normal caliber small bowel with no small bowel wall
thickening. Normal appendix. Moderate sigmoid diverticulosis with no
large bowel wall thickening or significant pericolonic fat
stranding.

Vascular/Lymphatic: Atherosclerotic nonaneurysmal abdominal aorta.
No pathologically enlarged lymph nodes in the abdomen or pelvis.

Reproductive: Grossly normal uterus.  No adnexal mass.

Other: No pneumoperitoneum, ascites or focal fluid collection.

Musculoskeletal: No aggressive appearing focal osseous lesions.
Marked lumbar spondylosis.

VASCULAR MEASUREMENTS PERTINENT TO TAVR:

AORTA:

Minimal Aortic 7iameter-XX.M x 11.2 mm

Severity of Aortic Calcification-moderate

RIGHT PELVIS:

Right Common Iliac Artery -

Minimal 7iameter-3.9 x 7.0 mm

Tortuosity-mild

Calcification-moderate

Right External Iliac Artery -

Minimal Iiameter-M.W x 7.1 mm

Tortuosity-mild

Calcification-none

Right Common Femoral Artery -

Minimal Yiameter-Q.K x 6.2 mm

Tortuosity-mild

Calcification-mild

LEFT PELVIS:

Left Common Iliac Artery -

Minimal Giameter-V.2 x 7.8 mm

Tortuosity-mild-to-moderate

Calcification-mild

Left External Iliac Artery -

Minimal Iiameter-M.W x 7.4 mm

Tortuosity-mild

Calcification-mild

Left Common Femoral Artery -

Minimal 7iameter-3.9 x 5.5 mm

Tortuosity-mild

Calcification-moderate

Review of the MIP images confirms the above findings.
IMPRESSION: 1. Vascular findings and measurements pertinent to potential TAVR
procedure, as detailed.
2. Diffuse thickening and coarse calcification of the aortic valve,
compatible with the reported history of severe aortic stenosis.
3. Mild cardiomegaly.
4. Complex 1.7 cm cystic pancreatic lesion in the uncinate process.
No biliary or main pancreatic duct dilation. Recommend further
evaluation with MRI abdomen without and with IV contrast at this
time.
5. Nonspecific 1.1 cm indistinct hypervascular focus in the right
hepatic lobe, probably benign. This finding can also be further
evaluated at MRI abdomen without and with IV contrast.
6. Mild splenomegaly.
7. Moderate sigmoid diverticulosis.
8. Small hiatal hernia.
9. Aortic Atherosclerosis ([id number]).

## 2021-04-25 ENCOUNTER — Other Ambulatory Visit: Payer: Medicare HMO

## 2021-05-02 IMAGING — CR DG CHEST 2V
2 series · 2 of 2 positions shown · non-contrast
Comparison: None.

CLINICAL DATA: Aortic stenosis

EXAM:
CHEST - 2 VIEW

[w chest pa]
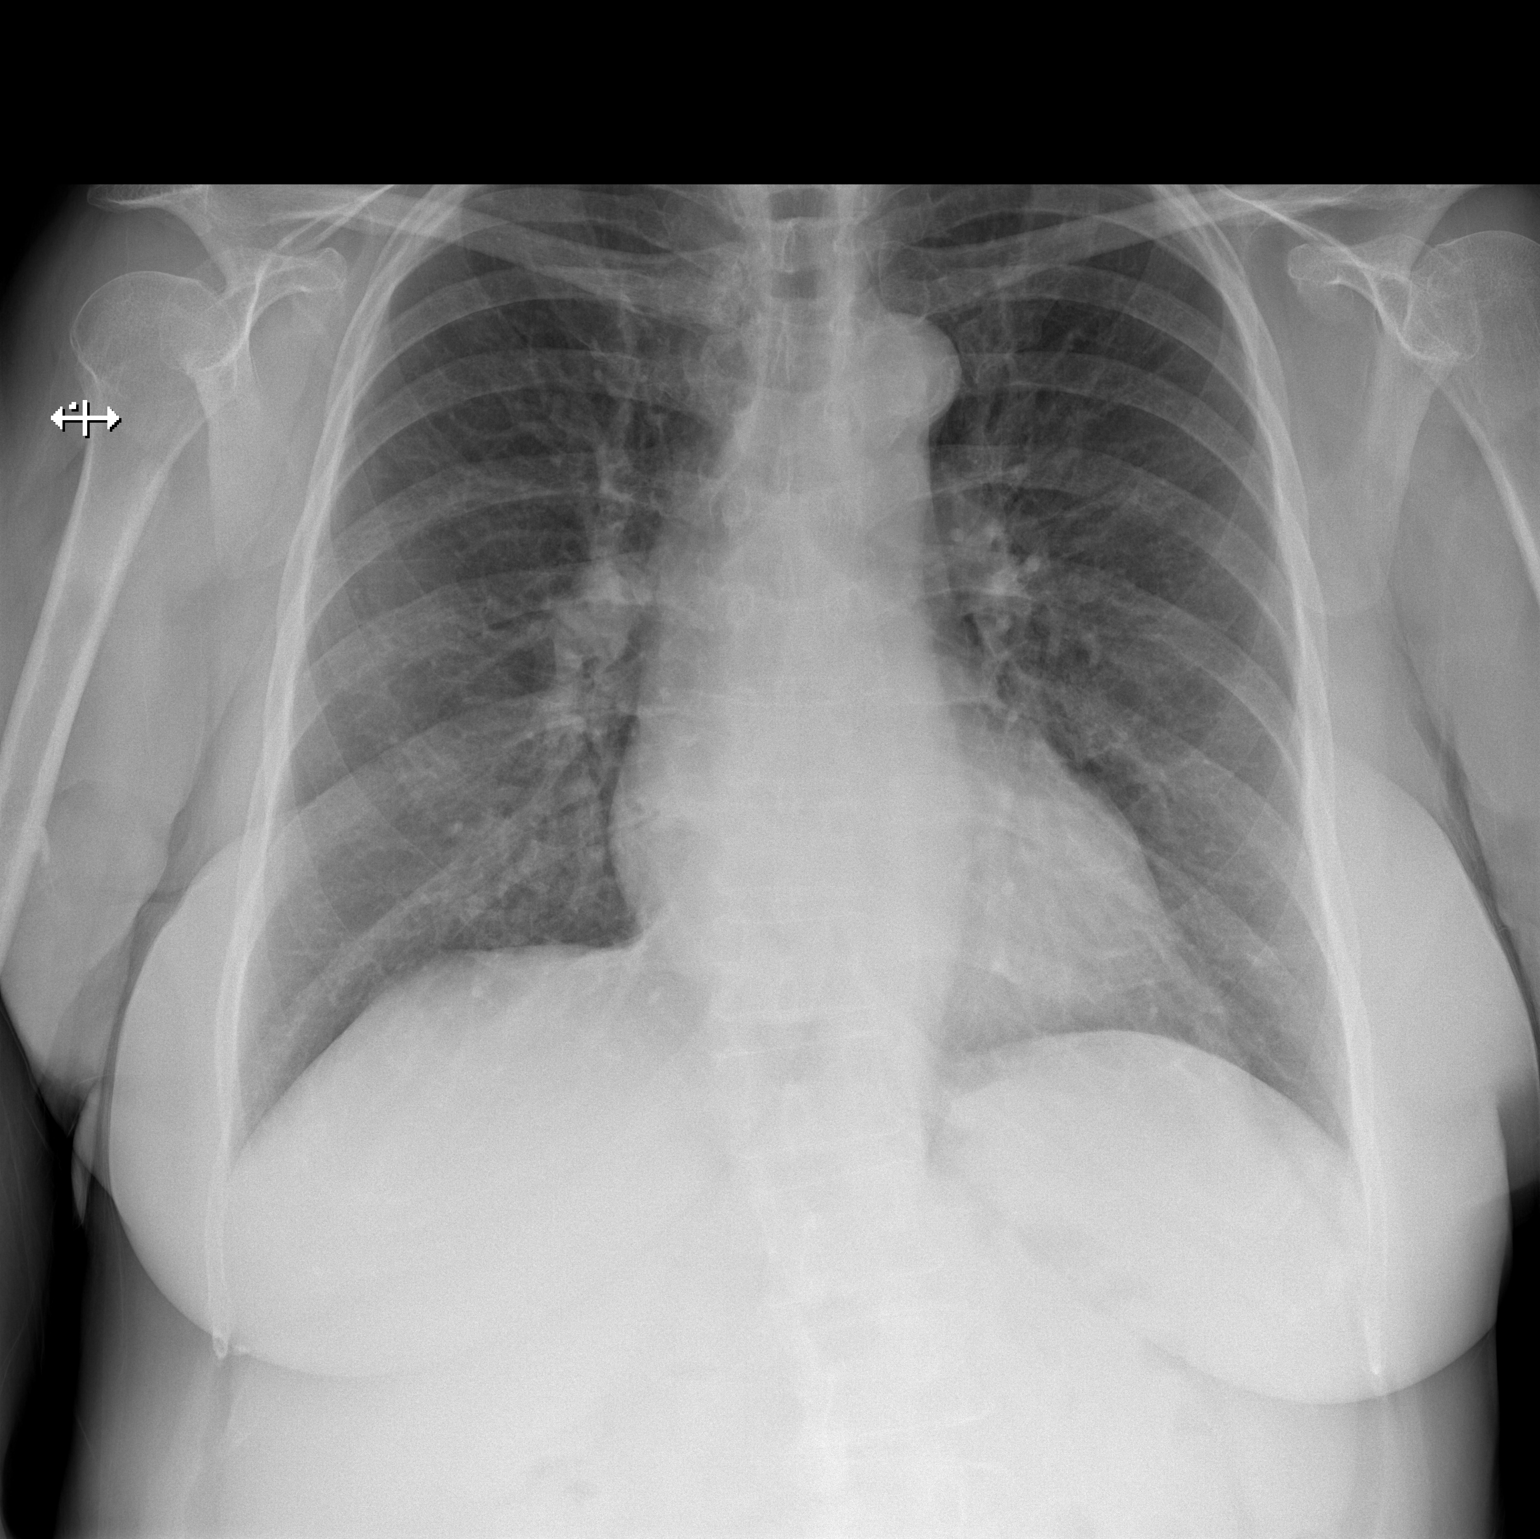

[w chest lat]
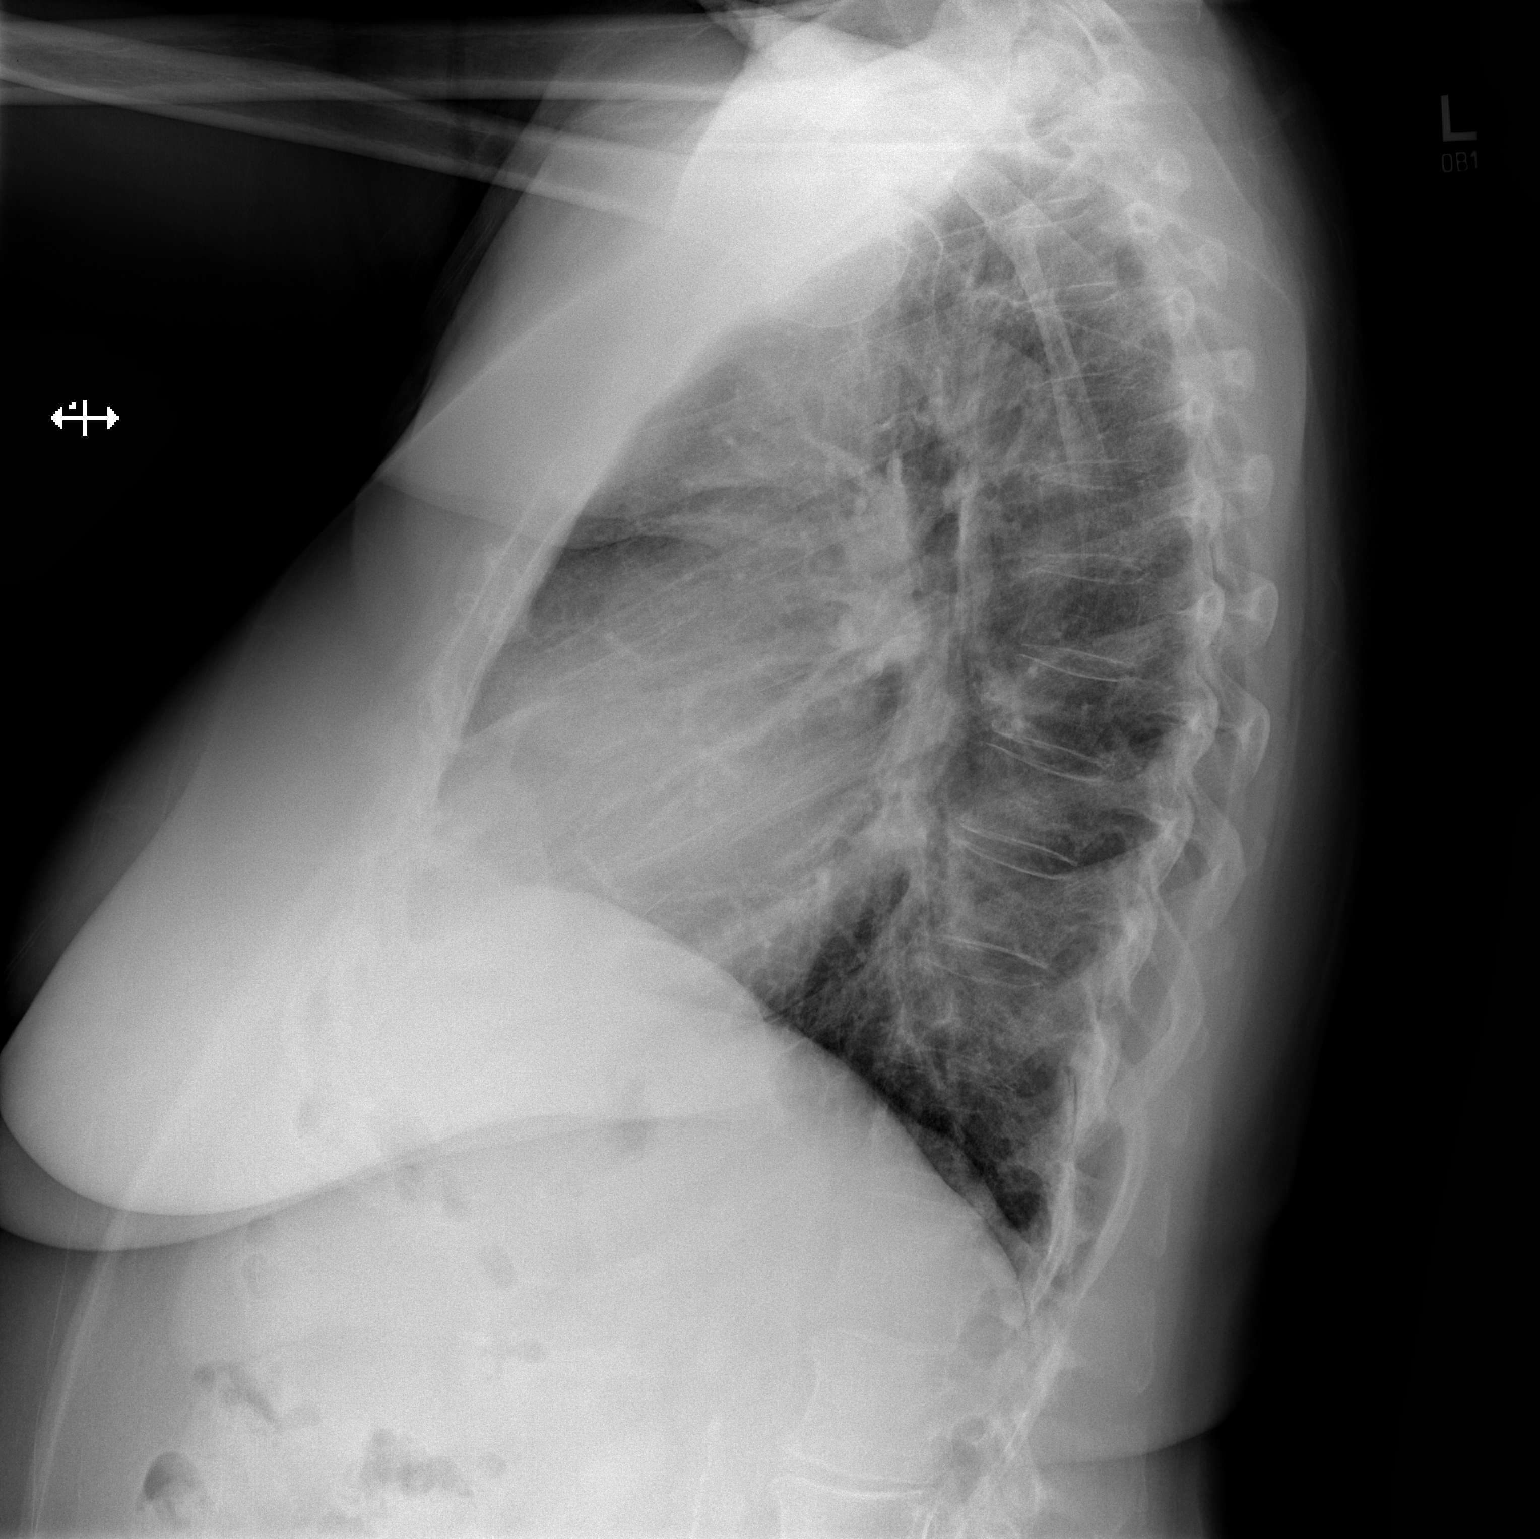

[2 of 2 positions shown; findings below may reference images not displayed]

FINDINGS: The heart size and mediastinal contours are within normal limits.
Both lungs are clear. The visualized skeletal structures are
unremarkable.
IMPRESSION: No active cardiopulmonary disease.

## 2021-05-03 ENCOUNTER — Ambulatory Visit: Payer: Medicare HMO

## 2021-05-03 ENCOUNTER — Other Ambulatory Visit: Payer: Self-pay

## 2021-05-03 DIAGNOSIS — Z952 Presence of prosthetic heart valve: Secondary | ICD-10-CM

## 2021-06-06 DIAGNOSIS — R509 Fever, unspecified: Secondary | ICD-10-CM | POA: Diagnosis not present

## 2021-06-06 DIAGNOSIS — R5383 Other fatigue: Secondary | ICD-10-CM | POA: Diagnosis not present

## 2021-06-06 DIAGNOSIS — N3000 Acute cystitis without hematuria: Secondary | ICD-10-CM | POA: Diagnosis not present

## 2021-06-06 DIAGNOSIS — M791 Myalgia, unspecified site: Secondary | ICD-10-CM | POA: Diagnosis not present

## 2021-06-06 DIAGNOSIS — E1165 Type 2 diabetes mellitus with hyperglycemia: Secondary | ICD-10-CM | POA: Diagnosis not present

## 2021-06-06 DIAGNOSIS — E559 Vitamin D deficiency, unspecified: Secondary | ICD-10-CM | POA: Diagnosis not present

## 2021-06-06 DIAGNOSIS — E7800 Pure hypercholesterolemia, unspecified: Secondary | ICD-10-CM | POA: Diagnosis not present

## 2021-06-06 DIAGNOSIS — Z20822 Contact with and (suspected) exposure to covid-19: Secondary | ICD-10-CM | POA: Diagnosis not present

## 2021-07-05 DIAGNOSIS — E559 Vitamin D deficiency, unspecified: Secondary | ICD-10-CM | POA: Diagnosis not present

## 2021-07-05 DIAGNOSIS — E7800 Pure hypercholesterolemia, unspecified: Secondary | ICD-10-CM | POA: Diagnosis not present

## 2021-07-05 DIAGNOSIS — Z952 Presence of prosthetic heart valve: Secondary | ICD-10-CM | POA: Diagnosis not present

## 2021-07-05 DIAGNOSIS — E1165 Type 2 diabetes mellitus with hyperglycemia: Secondary | ICD-10-CM | POA: Diagnosis not present

## 2021-07-11 ENCOUNTER — Other Ambulatory Visit: Payer: Medicare HMO

## 2021-07-13 ENCOUNTER — Ambulatory Visit: Payer: Medicare HMO

## 2021-07-13 ENCOUNTER — Other Ambulatory Visit: Payer: Self-pay

## 2021-07-13 DIAGNOSIS — I6523 Occlusion and stenosis of bilateral carotid arteries: Secondary | ICD-10-CM | POA: Diagnosis not present

## 2021-07-17 NOTE — Progress Notes (Signed)
Can you change her OV with me to 6 months from now after carotid duplex which I will be repeating.

## 2021-07-18 NOTE — Progress Notes (Signed)
Please contact patient to reschedule appointment, per [facility]

## 2021-07-19 ENCOUNTER — Other Ambulatory Visit: Payer: Self-pay | Admitting: Cardiology

## 2021-07-19 DIAGNOSIS — Z952 Presence of prosthetic heart valve: Secondary | ICD-10-CM

## 2021-07-19 DIAGNOSIS — I359 Nonrheumatic aortic valve disorder, unspecified: Secondary | ICD-10-CM

## 2021-07-21 DIAGNOSIS — R300 Dysuria: Secondary | ICD-10-CM | POA: Diagnosis not present

## 2021-07-21 DIAGNOSIS — N3000 Acute cystitis without hematuria: Secondary | ICD-10-CM | POA: Diagnosis not present

## 2021-07-21 DIAGNOSIS — R309 Painful micturition, unspecified: Secondary | ICD-10-CM | POA: Diagnosis not present

## 2021-08-08 DIAGNOSIS — Z1231 Encounter for screening mammogram for malignant neoplasm of breast: Secondary | ICD-10-CM | POA: Diagnosis not present

## 2021-08-10 ENCOUNTER — Encounter: Payer: Self-pay | Admitting: Obstetrics and Gynecology

## 2021-08-29 ENCOUNTER — Other Ambulatory Visit: Payer: Self-pay

## 2021-08-29 ENCOUNTER — Ambulatory Visit: Payer: Medicare HMO

## 2021-08-29 DIAGNOSIS — Z952 Presence of prosthetic heart valve: Secondary | ICD-10-CM | POA: Diagnosis not present

## 2021-08-29 DIAGNOSIS — I359 Nonrheumatic aortic valve disorder, unspecified: Secondary | ICD-10-CM | POA: Diagnosis not present

## 2021-08-31 DIAGNOSIS — E1165 Type 2 diabetes mellitus with hyperglycemia: Secondary | ICD-10-CM | POA: Diagnosis not present

## 2021-08-31 DIAGNOSIS — E7800 Pure hypercholesterolemia, unspecified: Secondary | ICD-10-CM | POA: Diagnosis not present

## 2021-08-31 DIAGNOSIS — Z0000 Encounter for general adult medical examination without abnormal findings: Secondary | ICD-10-CM | POA: Diagnosis not present

## 2021-08-31 DIAGNOSIS — E559 Vitamin D deficiency, unspecified: Secondary | ICD-10-CM | POA: Diagnosis not present

## 2021-09-06 ENCOUNTER — Telehealth: Payer: Self-pay | Admitting: Physician Assistant

## 2021-09-06 NOTE — Telephone Encounter (Signed)
  [facility]   MULTIDISCIPLINARY HEART VALVE TEAM  Was supposed to see Dr. Einar Gip in November 2022 but cancelled apt and r/s for March of next year, which is out of our 1 year window. Echo was successfully completed on 10/10. Attempted to call Ms Bushard to do 1 year TAVR assessment (KCCQ and NYHA class). I had also called two weeks ago and left VM. Left another VM today. Will try to reach her via MyChart.   Angelena Form PA-C  [facility]

## 2021-09-06 NOTE — Telephone Encounter (Signed)
Pt returned call and KCCQ completed over the phone, pt is NYHA 1 at this time.  The pt is also due for follow-up Abdominal MRI. Per pt she is claustrophobic and this has to be performed in an open scanner.  Last year the pt completed this tet at [facility], [phone].  I will assist with getting this test arranged.

## 2021-09-07 DIAGNOSIS — Z952 Presence of prosthetic heart valve: Secondary | ICD-10-CM | POA: Diagnosis not present

## 2021-09-07 DIAGNOSIS — E118 Type 2 diabetes mellitus with unspecified complications: Secondary | ICD-10-CM | POA: Diagnosis not present

## 2021-09-07 DIAGNOSIS — I10 Essential (primary) hypertension: Secondary | ICD-10-CM | POA: Diagnosis not present

## 2021-09-07 DIAGNOSIS — I350 Nonrheumatic aortic (valve) stenosis: Secondary | ICD-10-CM | POA: Diagnosis not present

## 2021-09-07 DIAGNOSIS — E7800 Pure hypercholesterolemia, unspecified: Secondary | ICD-10-CM | POA: Diagnosis not present
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
## 2021-09-21 ENCOUNTER — Ambulatory Visit (INDEPENDENT_AMBULATORY_CARE_PROVIDER_SITE_OTHER): Payer: Medicare HMO | Admitting: Obstetrics and Gynecology

## 2021-09-21 ENCOUNTER — Encounter: Payer: Self-pay | Admitting: Obstetrics and Gynecology

## 2021-09-21 ENCOUNTER — Other Ambulatory Visit: Payer: Self-pay

## 2021-09-21 ENCOUNTER — Other Ambulatory Visit (HOSPITAL_COMMUNITY)
Admission: RE | Admit: 2021-09-21 | Discharge: 2021-09-21 | Disposition: A | Discharge status: Home / Self Care | Payer: Medicare HMO | Source: Ambulatory Visit | Attending: Obstetrics and Gynecology | Admitting: Obstetrics and Gynecology

## 2021-09-21 VITALS — BP 160/84 | HR 75 | Ht 68.0 in | Wt 191.0 lb

## 2021-09-21 DIAGNOSIS — N760 Acute vaginitis: Secondary | ICD-10-CM | POA: Diagnosis not present

## 2021-09-21 DIAGNOSIS — N766 Ulceration of vulva: Secondary | ICD-10-CM

## 2021-09-21 MED ORDER — VALACYCLOVIR HCL 500 MG PO TABS: ORAL_TABLET | ORAL | 0 refills | Status: DC

## 2021-09-21 MED ORDER — LIDOCAINE 5 % EX OINT: 1.0000 "application " | TOPICAL_OINTMENT | Freq: Three times a day (TID) | CUTANEOUS | 1 refills | Status: DC

## 2021-09-21 MED ORDER — TERCONAZOLE 0.4 % VA CREA: 1.0000 | TOPICAL_CREAM | Freq: Every day | VAGINAL | 0 refills | Status: DC

## 2021-09-22 ENCOUNTER — Telehealth: Payer: Self-pay | Admitting: *Deleted

## 2021-09-22 DIAGNOSIS — N766 Ulceration of vulva: Secondary | ICD-10-CM | POA: Diagnosis not present

## 2021-09-22 NOTE — Telephone Encounter (Signed)
Medication approved by [facility]  until 12/21/2021. [facility] informed as well.

## 2021-09-22 NOTE — Telephone Encounter (Signed)
PA done via cover my meds for lidocaine 5% ointment will wait for response from [facility].

## 2021-09-23 LAB — CERVICOVAGINAL ANCILLARY ONLY
Bacterial Vaginitis (gardnerella): NEGATIVE
Candida Glabrata: NEGATIVE
Candida Vaginitis: NEGATIVE
Chlamydia: NEGATIVE
Comment: NEGATIVE
Comment: NEGATIVE
Comment: NEGATIVE
Comment: NEGATIVE
Comment: NEGATIVE
Comment: NORMAL
Neisseria Gonorrhea: NEGATIVE
Trichomonas: NEGATIVE

## 2021-09-26 LAB — SURESWAB HSV, TYPE 1/2 DNA, PCR
HSV 1 DNA: NOT DETECTED
HSV 2 DNA: DETECTED — AB

## 2021-10-04 DIAGNOSIS — H25813 Combined forms of age-related cataract, bilateral: Secondary | ICD-10-CM | POA: Diagnosis not present

## 2021-10-17 ENCOUNTER — Ambulatory Visit: Payer: Medicare HMO | Admitting: Cardiology

## 2021-12-14 ENCOUNTER — Ambulatory Visit: Payer: Medicare HMO | Admitting: Obstetrics and Gynecology

## 2022-01-18 ENCOUNTER — Other Ambulatory Visit: Payer: Medicare HMO

## 2022-01-18 ENCOUNTER — Other Ambulatory Visit: Payer: Self-pay

## 2022-01-18 ENCOUNTER — Ambulatory Visit: Payer: Medicare HMO

## 2022-01-18 DIAGNOSIS — I6523 Occlusion and stenosis of bilateral carotid arteries: Secondary | ICD-10-CM | POA: Diagnosis not present

## 2022-01-24 ENCOUNTER — Other Ambulatory Visit: Payer: Self-pay

## 2022-01-24 ENCOUNTER — Ambulatory Visit: Payer: Medicare HMO | Admitting: Cardiology

## 2022-01-24 ENCOUNTER — Encounter: Payer: Self-pay | Admitting: Cardiology

## 2022-01-24 VITALS — BP 122/66 | HR 75 | Temp 97.9°F | Resp 16 | Ht 68.0 in | Wt 177.0 lb

## 2022-01-24 DIAGNOSIS — E7800 Pure hypercholesterolemia, unspecified: Secondary | ICD-10-CM | POA: Diagnosis not present

## 2022-01-24 DIAGNOSIS — Z952 Presence of prosthetic heart valve: Secondary | ICD-10-CM | POA: Diagnosis not present

## 2022-01-24 DIAGNOSIS — I10 Essential (primary) hypertension: Secondary | ICD-10-CM

## 2022-01-24 DIAGNOSIS — I6523 Occlusion and stenosis of bilateral carotid arteries: Secondary | ICD-10-CM | POA: Diagnosis not present

## 2022-01-24 NOTE — Progress Notes (Signed)
Primary Physician/Referring:  Deland Pretty, MD  Patient ID: [name], female    DOB: Oct 29, 1951, 71 y.o.   MRN: [id number]  No chief complaint on file.  HPI:    IREENE BALLOWE  is a 71 y.o. Caucasian female with moderate aortic stenosis, hypertension, hyperlipidemia, asymptomatic bilateral carotid artery stenosis, diabetes mellitus, severe aortic stenosis for which he underwent TAVR with implantation of a 26 mm Edwards Sapien 3 THV 09/16/2020.    Patient presents for 6 month follow up of hypertension, hyperlipidemia, bilateral carotid stenosis and TAVR.  She has lost about 15 pounds in weight and has noticed significant improvement in overall wellbeing.  She is essentially asymptomatic.   Past Medical History:  Diagnosis Date   Allergy    seasonal   Arthritis    Cancer ([place])    skin cancer (not melanoma)   DES exposure in utero    Diabetes mellitus without complication ([place])    GERD (gastroesophageal reflux disease)    Herpes    Hypertension    Severe aortic stenosis    STD (sexually transmitted disease)    HSV II   Urinary incontinence    with exercise/cough/laughing   Social History   Tobacco Use   Smoking status: Former    Types: Cigarettes    Quit date: 03/26/1984    Years since quitting: 37.8   Smokeless tobacco: Never  Substance Use Topics   Alcohol use: No    Alcohol/week: 0.0 standard drinks   ROS  Review of Systems  Cardiovascular:  Negative for chest pain, dyspnea on exertion and leg swelling.  Objective  Blood pressure 122/66, pulse 75, temperature 97.9 F (36.6 C), temperature source Temporal, resp. rate 16, height _0  (1.727 m), weight 177 lb (80.3 kg), last menstrual period 11/20/2006, SpO2 95 %.  Vitals with BMI 01/24/2022 09/21/2021 04/12/2021  Height _1  _2  _3   Weight 177 lbs 191 lbs 181 lbs 10 oz  BMI 26.92 25.85 27.78  Systolic 242 353 614  Diastolic 66 84 66  Pulse 75 75 70    Physical Exam Neck:     Vascular: No JVD.   Cardiovascular:     Rate and Rhythm: Normal rate and regular rhythm.     Pulses: Normal pulses and intact distal pulses.          Carotid pulses are  on the right side with bruit and  on the left side with bruit.    Heart sounds: Normal heart sounds, S1 normal and S2 normal. No murmur heard.   No gallop.  Pulmonary:     Effort: Pulmonary effort is normal. No respiratory distress.     Breath sounds: No wheezing, rhonchi or rales.  Abdominal:     General: Bowel sounds are normal.     Palpations: Abdomen is soft.  Musculoskeletal:     Right lower leg: No edema.     Left lower leg: No edema.   Laboratory examination:   External labs:   Labs 08/31/2021:  A1c 6.9%.  Total cholesterol 152, triglycerides 171, HDL 42, LDL 76.  Hb 14.4/HCT 36.5, platelets 169, normal indicis.  BUN 11, creatinine 0.74, EGFR 81 mL, potassium 3.8, LFTs normal.  Labs 02/23/2021:  Vitamin D 27.1.  Medications and allergies   Allergies  Allergen Reactions   Codeine Nausea And Vomiting and Other (See Comments)   Metformin Hcl Other (See Comments) and Diarrhea    Current Outpatient Medications:    amLODipine (NORVASC) 10 MG  tablet, Take 1 tablet by mouth daily., Disp: , Rfl:    aspirin 81 MG EC tablet, 1 tablet, Disp: , Rfl:    Biotin 5 MG CAPS, Take 5 mg by mouth daily., Disp: , Rfl:    Cyanocobalamin (B-12) 1000 MCG SUBL, 1 tablet, Disp: , Rfl:    EASY TOUCH SAFETY LANCETS 28G MISC, USE TO CHECK BLOOD SUGAR EVERY DAY FOR 90 DAYS AS DIRECTED, Disp: , Rfl: 3   metoprolol succinate (TOPROL-XL) 50 MG 24 hr tablet, 1 tablet, Disp: , Rfl:    ONE TOUCH ULTRA TEST test strip, 1 each by Other route 2 (two) times a week., Disp: , Rfl:    pantoprazole (PROTONIX) 40 MG tablet, Take 1 tablet by mouth daily., Disp: , Rfl:    rosuvastatin (CRESTOR) 20 MG tablet, 1/2 tablet, Disp: , Rfl:    Semaglutide,0.25 or 0.5MG/DOS, (OZEMPIC, 0.25 OR 0.5 MG/DOSE,) 2 MG/1.5ML SOPN, Inject 0.5 mg into the skin once a week. ,  Disp: , Rfl:    sertraline (ZOLOFT) 100 MG tablet, Take 100 mg by mouth daily. , Disp: , Rfl:    sodium chloride (OCEAN) 0.65 % SOLN nasal spray, Place 1 spray into both nostrils daily., Disp: , Rfl:    telmisartan (MICARDIS) 80 MG tablet, Take 80 mg by mouth daily., Disp: , Rfl: 6   terconazole (TERAZOL 7) 0.4 % vaginal cream, Place 1 applicator vaginally at bedtime. One applicator full QHS for seven days of therapy, Disp: 45 g, Rfl: 0   valACYclovir (VALTREX) 500 MG tablet, Take one tablet (500 mg) by mouth twice a day for 3 days for an outbreak.  Take one tablet my mouth daily for infection prevention., Disp: 110 tablet, Rfl: 0    Radiology:  No results found.  Cardiac Studies:   Exercise Treadmill Stress Test 02/11/2018: Indication:SOB The patient exercised on Bruce protocol for 07:35 min. Patient achieved 8.81 METS and reached HR 140 bpm, which is 91% of maximum age-predicted HR. Stress test terminated due to fatigue.  Exercise capacity was normal. HR Response to Exercise: Appropriate. BP Response to Exercise: Resting hypertension- exaggerated response, peak 202/90 mmHg Chest Pain: none. Arrhythmias: Occasional PVC 's. ST Changes: With peak exercise there was no ST-T changes of ischemia. Overall Impression: Normal stress test. Continue primary/secondary prevention.  Right and left heart catheterization 07/20/2020: Normal LV systolic function.  Severe aortic stenosis.  Peak to peak gradient 71 mmHg, mean gradient 61.1 mmHg.  Aortic valve area 0.91 cm. Normal coronary arteries, right dominant circulation. Normal right heart catheterization with normal pressures and preserved cardiac output and cardiac index.   Recommendation: Patient will need evaluation for aortic valve replacement.  60 mL contrast utilized.  Outpatient extended EKG monitoring 2 weeks on 09/15/2020: Indication: Heart block Predominant rhythm is normal sinus rhythm.  1 run of ventricular tachycardia for 4 beats.   25 SVT, longest 3 minutes 32 seconds at rate of 121 bpm, suggestive of atrial tachycardia, occasional PVCs and PACs.  No heart block.  No atrial fibrillation.  Occasional rate related bundle branch block.  PCV ECHOCARDIOGRAM COMPLETE 08/29/2021  Narrative Echocardiogram 08/29/2021: Normal LV systolic function with visual EF 55-60%. Left ventricle cavity is normal in size. Mild left ventricular hypertrophy, basal hypertrophy.  Normal global wall motion. Normal diastolic filling pattern, normal LAP. Bioprosthetic 26 mm Ultra Sapien valve, well seated and normal functioning.  No valvular stenosis or regurgitation. No paravalvular regurgitation. No other significant valvular abnormality. Compared to study 05/03/2021 no significant change.  Carotid artery duplex 01/18/2022: Duplex suggests stenosis in the right internal carotid artery (16-49%). Duplex suggests stenosis in the left internal carotid artery (16-49%). There is moderate heterogenous plaque noted in the bilateral carotid bulb and origin of CCA. Antegrade right vertebral artery flow. Antegrade left vertebral artery flow. Compared to 07/13/2021, there is mild regression of stenosis severity bilaterally. Follow up in one year is appropriate if clinically indicated.  EKG   EKG 01/24/2022: Sinus rhythm with first-degree block at rate of 64 bpm, left axis deviation, left anterior fascicular block.  Anteroseptal infarct old.  Nonspecific T abnormality.  Occasional PACs.  EKG 02/28/2021: Sinus rhythm with first-degree AV block at a rate of 55 bpm.  Left bundle branch block.  No further analysis.  Compared to EKG 09/15/2020, no significant change.  Assessment     ICD-10-CM   1. Primary hypertension  I10 EKG 12-Lead    PCV ECHOCARDIOGRAM COMPLETE    2. S/P TAVR (transcatheter aortic valve replacement)  Z95.2 PCV ECHOCARDIOGRAM COMPLETE    3. Asymptomatic bilateral carotid artery stenosis  I65.23 PCV CAROTID DUPLEX (BILATERAL)    4.  Hypercholesteremia  E78.00        No orders of the defined types were placed in this encounter.  Medications Discontinued During This Encounter  Medication Reason   albuterol (VENTOLIN HFA) 108 (90 Base) MCG/ACT inhaler    ferrous sulfate 325 (65 FE) MG tablet    furosemide (LASIX) 20 MG tablet    lidocaine (XYLOCAINE) 5 % ointment    Vitamin D, Ergocalciferol, (DRISDOL) 1.25 MG (50000 UNIT) CAPS capsule      Orders Placed This Encounter  Procedures   EKG 12-Lead   PCV ECHOCARDIOGRAM COMPLETE    Standing Status:   Future    Standing Expiration Date:   01/25/2023    Recommendations:   MEDHA PIPPEN  is a 71 y.o. Caucasian female with moderate aortic stenosis, hypertension, hyperlipidemia, asymptomatic bilateral carotid artery stenosis, diabetes mellitus, severe aortic stenosis for which he underwent TAVR with implantation of a 26 mm Edwards Sapien 3 THV 09/16/2020.    Patient presents for 6 month follow up of hypertension, hyperlipidemia, bilateral carotid stenosis and TAVR.  She has lost about 15 pounds in weight and has noticed significant improvement in overall wellbeing, diabetes being treated by Ozempic.  She is essentially asymptomatic.  I reviewed her labs, lipids under excellent control.  Carotid duplex reveals mild regression of carotid artery stenosis severity.  She will need annual surveillance now.  Blood pressure is well controlled, report of the echocardiogram reviewed, TAVR stable.  We will repeat echocardiogram in a year along with her carotid artery duplex and see her back at that time.   Adrian Prows, PA-C 01/24/2022, 5:37 PM Office: [phone]
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
## 2022-02-15 ENCOUNTER — Other Ambulatory Visit (HOSPITAL_COMMUNITY)
Admission: RE | Admit: 2022-02-15 | Discharge: 2022-02-15 | Disposition: A | Discharge status: Home / Self Care | Payer: Medicare HMO | Source: Ambulatory Visit | Attending: Obstetrics and Gynecology | Admitting: Obstetrics and Gynecology

## 2022-02-15 ENCOUNTER — Encounter: Payer: Self-pay | Admitting: Obstetrics and Gynecology

## 2022-02-15 ENCOUNTER — Ambulatory Visit: Payer: Medicare HMO | Admitting: Obstetrics and Gynecology

## 2022-02-15 VITALS — BP 148/78 | HR 65 | Ht 67.5 in | Wt 176.0 lb

## 2022-02-15 DIAGNOSIS — Z1151 Encounter for screening for human papillomavirus (HPV): Secondary | ICD-10-CM | POA: Insufficient documentation

## 2022-02-15 DIAGNOSIS — Z9189 Other specified personal risk factors, not elsewhere classified: Secondary | ICD-10-CM

## 2022-02-15 DIAGNOSIS — Z124 Encounter for screening for malignant neoplasm of cervix: Secondary | ICD-10-CM

## 2022-02-15 DIAGNOSIS — R69 Illness, unspecified: Secondary | ICD-10-CM | POA: Diagnosis not present

## 2022-02-15 DIAGNOSIS — R300 Dysuria: Secondary | ICD-10-CM

## 2022-02-15 DIAGNOSIS — Z01419 Encounter for gynecological examination (general) (routine) without abnormal findings: Secondary | ICD-10-CM

## 2022-02-15 DIAGNOSIS — B009 Herpesviral infection, unspecified: Secondary | ICD-10-CM | POA: Diagnosis not present

## 2022-02-15 MED ORDER — SULFAMETHOXAZOLE-TRIMETHOPRIM 800-160 MG PO TABS: 1.0000 | ORAL_TABLET | Freq: Two times a day (BID) | ORAL | 0 refills | Status: DC

## 2022-02-15 MED ORDER — VALACYCLOVIR HCL 500 MG PO TABS: ORAL_TABLET | ORAL | 3 refills | Status: DC

## 2022-02-15 NOTE — Patient Instructions (Signed)

## 2022-02-18 LAB — URINE CULTURE
MICRO NUMBER:: 13195821
SPECIMEN QUALITY:: ADEQUATE

## 2022-02-18 LAB — URINALYSIS, COMPLETE W/RFL CULTURE
Bilirubin Urine: NEGATIVE
Glucose, UA: NEGATIVE
Hgb urine dipstick: NEGATIVE
Hyaline Cast: NONE SEEN /LPF
Ketones, ur: NEGATIVE
Nitrites, Initial: NEGATIVE
Protein, ur: NEGATIVE
Specific Gravity, Urine: 1.02 (ref 1.001–1.035)
pH: 5.5 (ref 5.0–8.0)

## 2022-02-18 LAB — CULTURE INDICATED

## 2022-02-20 ENCOUNTER — Other Ambulatory Visit: Payer: Self-pay | Admitting: Obstetrics and Gynecology

## 2022-02-20 ENCOUNTER — Telehealth: Payer: Self-pay

## 2022-02-20 MED ORDER — SULFAMETHOXAZOLE-TRIMETHOPRIM 800-160 MG PO TABS: 1.0000 | ORAL_TABLET | Freq: Two times a day (BID) | ORAL | 0 refills | Status: DC

## 2022-02-20 NOTE — Telephone Encounter (Signed)
Spoke with patient and informed her. °

## 2022-02-20 NOTE — Telephone Encounter (Signed)
Prescription resent to her pharmacy for Bactrim DS po bid x 3 days.  ?

## 2022-02-20 NOTE — Telephone Encounter (Signed)
Patient was treated for UTI last week with generic Bactrim DS bid x 3 days. She called today because she said she took one day of the antibiotic and then lost the bottle. She said she has searched everywhere and cannot locate it. Concerned she may have thrown it away. She asked if you would be willing to send the Rx again? ?

## 2022-02-21 LAB — CYTOLOGY - PAP
Comment: NEGATIVE
Diagnosis: NEGATIVE
High risk HPV: NEGATIVE

## 2022-02-27 DIAGNOSIS — E559 Vitamin D deficiency, unspecified: Secondary | ICD-10-CM | POA: Diagnosis not present

## 2022-02-27 DIAGNOSIS — I10 Essential (primary) hypertension: Secondary | ICD-10-CM | POA: Diagnosis not present

## 2022-02-27 DIAGNOSIS — E118 Type 2 diabetes mellitus with unspecified complications: Secondary | ICD-10-CM | POA: Diagnosis not present

## 2022-03-06 DIAGNOSIS — I350 Nonrheumatic aortic (valve) stenosis: Secondary | ICD-10-CM | POA: Diagnosis not present

## 2022-03-06 DIAGNOSIS — Z952 Presence of prosthetic heart valve: Secondary | ICD-10-CM | POA: Diagnosis not present

## 2022-03-06 DIAGNOSIS — E7800 Pure hypercholesterolemia, unspecified: Secondary | ICD-10-CM | POA: Diagnosis not present

## 2022-03-06 DIAGNOSIS — I6529 Occlusion and stenosis of unspecified carotid artery: Secondary | ICD-10-CM | POA: Diagnosis not present

## 2022-03-06 DIAGNOSIS — I10 Essential (primary) hypertension: Secondary | ICD-10-CM | POA: Diagnosis not present

## 2022-03-06 DIAGNOSIS — K219 Gastro-esophageal reflux disease without esophagitis: Secondary | ICD-10-CM | POA: Diagnosis not present

## 2022-03-06 DIAGNOSIS — E119 Type 2 diabetes mellitus without complications: Secondary | ICD-10-CM | POA: Diagnosis not present

## 2022-03-06 DIAGNOSIS — Z0000 Encounter for general adult medical examination without abnormal findings: Secondary | ICD-10-CM | POA: Diagnosis not present

## 2022-03-06 DIAGNOSIS — E559 Vitamin D deficiency, unspecified: Secondary | ICD-10-CM | POA: Diagnosis not present

## 2022-04-12 ENCOUNTER — Other Ambulatory Visit: Payer: Self-pay | Admitting: Obstetrics and Gynecology

## 2022-04-13 NOTE — Telephone Encounter (Signed)
I called [facility] regarding refill and they patient has refills and she just picked up. Rx denied.

## 2022-06-01 DIAGNOSIS — H25813 Combined forms of age-related cataract, bilateral: Secondary | ICD-10-CM | POA: Diagnosis not present

## 2022-06-05 DIAGNOSIS — E119 Type 2 diabetes mellitus without complications: Secondary | ICD-10-CM | POA: Diagnosis not present

## 2022-06-12 DIAGNOSIS — I10 Essential (primary) hypertension: Secondary | ICD-10-CM | POA: Diagnosis not present

## 2022-06-12 DIAGNOSIS — E559 Vitamin D deficiency, unspecified: Secondary | ICD-10-CM | POA: Diagnosis not present

## 2022-06-12 DIAGNOSIS — E1159 Type 2 diabetes mellitus with other circulatory complications: Secondary | ICD-10-CM | POA: Diagnosis not present

## 2022-06-12 DIAGNOSIS — E7800 Pure hypercholesterolemia, unspecified: Secondary | ICD-10-CM | POA: Diagnosis not present

## 2022-06-14 NOTE — Progress Notes (Signed)
Labs 06/05/2022:  A1c 6.6%.  Total cholesterol 148, triglycerides 158, HDL 43, LDL 73.  BUN 10, creatinine 0.81, potassium 3.5, EGFR 72 mL, LFTs normal, serum glucose 159.Marland Kitchen  Hb 13.7/HCT 41.5, platelets 200, normal indicis.

## 2022-06-19 DIAGNOSIS — M7661 Achilles tendinitis, right leg: Secondary | ICD-10-CM | POA: Diagnosis not present

## 2022-08-04 ENCOUNTER — Ambulatory Visit: Payer: Medicare HMO | Admitting: Podiatry

## 2022-08-14 DIAGNOSIS — Z1231 Encounter for screening mammogram for malignant neoplasm of breast: Secondary | ICD-10-CM | POA: Diagnosis not present

## 2022-08-16 ENCOUNTER — Encounter: Payer: Self-pay | Admitting: Obstetrics and Gynecology

## 2022-10-02 DIAGNOSIS — E1159 Type 2 diabetes mellitus with other circulatory complications: Secondary | ICD-10-CM | POA: Diagnosis not present

## 2022-10-02 DIAGNOSIS — E7800 Pure hypercholesterolemia, unspecified: Secondary | ICD-10-CM | POA: Diagnosis not present

## 2022-10-09 DIAGNOSIS — E7800 Pure hypercholesterolemia, unspecified: Secondary | ICD-10-CM | POA: Diagnosis not present

## 2022-10-09 DIAGNOSIS — E1159 Type 2 diabetes mellitus with other circulatory complications: Secondary | ICD-10-CM | POA: Diagnosis not present

## 2022-10-09 DIAGNOSIS — I10 Essential (primary) hypertension: Secondary | ICD-10-CM | POA: Diagnosis not present

## 2022-10-09 DIAGNOSIS — E559 Vitamin D deficiency, unspecified: Secondary | ICD-10-CM | POA: Diagnosis not present

## 2022-10-23 DIAGNOSIS — M7661 Achilles tendinitis, right leg: Secondary | ICD-10-CM | POA: Diagnosis not present

## 2022-11-21 DIAGNOSIS — E1159 Type 2 diabetes mellitus with other circulatory complications: Secondary | ICD-10-CM | POA: Diagnosis not present

## 2022-11-21 DIAGNOSIS — I10 Essential (primary) hypertension: Secondary | ICD-10-CM | POA: Diagnosis not present

## 2022-11-21 DIAGNOSIS — E118 Type 2 diabetes mellitus with unspecified complications: Secondary | ICD-10-CM | POA: Diagnosis not present

## 2022-11-21 DIAGNOSIS — E7800 Pure hypercholesterolemia, unspecified: Secondary | ICD-10-CM | POA: Diagnosis not present

## 2022-11-21 DIAGNOSIS — I6529 Occlusion and stenosis of unspecified carotid artery: Secondary | ICD-10-CM | POA: Diagnosis not present

## 2022-11-21 DIAGNOSIS — Z952 Presence of prosthetic heart valve: Secondary | ICD-10-CM | POA: Diagnosis not present

## 2022-12-19 DIAGNOSIS — E1159 Type 2 diabetes mellitus with other circulatory complications: Secondary | ICD-10-CM | POA: Diagnosis not present

## 2022-12-19 DIAGNOSIS — E7800 Pure hypercholesterolemia, unspecified: Secondary | ICD-10-CM | POA: Diagnosis not present

## 2022-12-19 DIAGNOSIS — I10 Essential (primary) hypertension: Secondary | ICD-10-CM | POA: Diagnosis not present

## 2022-12-19 DIAGNOSIS — I6529 Occlusion and stenosis of unspecified carotid artery: Secondary | ICD-10-CM | POA: Diagnosis not present

## 2022-12-19 DIAGNOSIS — Z952 Presence of prosthetic heart valve: Secondary | ICD-10-CM | POA: Diagnosis not present

## 2023-01-08 DIAGNOSIS — E1159 Type 2 diabetes mellitus with other circulatory complications: Secondary | ICD-10-CM | POA: Diagnosis not present

## 2023-01-08 DIAGNOSIS — E7800 Pure hypercholesterolemia, unspecified: Secondary | ICD-10-CM | POA: Diagnosis not present

## 2023-01-15 DIAGNOSIS — E7800 Pure hypercholesterolemia, unspecified: Secondary | ICD-10-CM | POA: Diagnosis not present

## 2023-01-15 DIAGNOSIS — D509 Iron deficiency anemia, unspecified: Secondary | ICD-10-CM | POA: Diagnosis not present

## 2023-01-15 DIAGNOSIS — I10 Essential (primary) hypertension: Secondary | ICD-10-CM | POA: Diagnosis not present

## 2023-01-15 DIAGNOSIS — E559 Vitamin D deficiency, unspecified: Secondary | ICD-10-CM | POA: Diagnosis not present

## 2023-01-15 DIAGNOSIS — E1159 Type 2 diabetes mellitus with other circulatory complications: Secondary | ICD-10-CM | POA: Diagnosis not present

## 2023-01-16 ENCOUNTER — Encounter: Payer: Self-pay | Admitting: Cardiology

## 2023-01-17 NOTE — Progress Notes (Signed)
Labs 01/08/2023:  BUN 14, creatinine 0.83, EGFR 70 mL, potassium 3.7.  LFTs normal.  A1c 8.0%.  Total cholesterol 145, triglycerides 119, HDL 48, LDL 75.

## 2023-01-24 ENCOUNTER — Other Ambulatory Visit: Payer: Medicare HMO

## 2023-01-31 ENCOUNTER — Ambulatory Visit: Payer: Medicare HMO | Admitting: Cardiology

## 2023-01-31 NOTE — Progress Notes (Deleted)
Primary Physician/Referring:  Deland Pretty, MD  Patient ID: Mary Reed, female    DOB: 1951-03-13, 72 y.o.   MRN: [id number]  No chief complaint on file.  HPI:    RYLIE MCVAUGH  is a 72 y.o. Caucasian female with moderate aortic stenosis, hypertension, hyperlipidemia, asymptomatic bilateral carotid artery stenosis, diabetes mellitus, severe aortic stenosis for which he underwent TAVR with implantation of a 26 mm Edwards Sapien 3 THV 09/16/2020.    Patient presents for 6 month follow up of hypertension, hyperlipidemia, bilateral carotid stenosis and TAVR.  She has lost about 15 pounds in weight and has noticed significant improvement in overall wellbeing.  She is essentially asymptomatic.   Past Medical History:  Diagnosis Date   Allergy    seasonal   Arthritis    Cancer ([place])    skin cancer (not melanoma)   DES exposure in utero    Diabetes mellitus without complication ([place])    GERD (gastroesophageal reflux disease)    Herpes    Hypertension    Severe aortic stenosis    STD (sexually transmitted disease)    HSV II   Urinary incontinence    with exercise/cough/laughing   Social History   Tobacco Use   Smoking status: Former    Types: Cigarettes    Quit date: 03/26/1984    Years since quitting: 38.8   Smokeless tobacco: Never  Substance Use Topics   Alcohol use: No    Alcohol/week: 0.0 standard drinks of alcohol   ROS  Review of Systems  Cardiovascular:  Negative for chest pain, dyspnea on exertion and leg swelling.   Objective  Last menstrual period 11/20/2006.     02/15/2022    2:19 PM 01/24/2022    3:23 PM 09/21/2021    1:58 PM  Vitals with BMI  Height 5' 7.5" '5\' 8"'$  '5\' 8"'$   Weight 176 lbs 177 lbs 191 lbs  BMI 27.14 [id number]  Systolic [id number] [id number]  Diastolic 78 66 84  Pulse 65 75 75    Physical Exam Neck:     Vascular: No JVD.  Cardiovascular:     Rate and Rhythm: Normal rate and regular rhythm.     Pulses: Normal pulses and intact distal  pulses.          Carotid pulses are  on the right side with bruit and  on the left side with bruit.    Heart sounds: Normal heart sounds, S1 normal and S2 normal. No murmur heard.    No gallop.  Pulmonary:     Effort: Pulmonary effort is normal. No respiratory distress.     Breath sounds: No wheezing, rhonchi or rales.  Abdominal:     General: Bowel sounds are normal.     Palpations: Abdomen is soft.  Musculoskeletal:     Right lower leg: No edema.     Left lower leg: No edema.    Laboratory examination:   External labs:   Labs 01/08/2023:  BUN 14, creatinine 0.83, EGFR 70 mL, potassium 3.7.  LFTs normal.  A1c 8.0%.  Total cholesterol 145, triglycerides 119, HDL 48, LDL 75.  Labs 08/31/2021:  A1c 6.9%.  Total cholesterol 152, triglycerides 171, HDL 42, LDL 76.  Hb 14.4/HCT 36.5, platelets 169, normal indicis.  BUN 11, creatinine 0.74, EGFR 81 mL, potassium 3.8, LFTs normal.  Labs 02/23/2021:  Vitamin D 27.1.  Medications and allergies   Allergies  Allergen Reactions   Codeine Nausea And Vomiting and Other (See Comments)  Metformin Hcl Other (See Comments) and Diarrhea    Current Outpatient Medications:    amLODipine (NORVASC) 10 MG tablet, Take 1 tablet by mouth daily., Disp: , Rfl:    aspirin 81 MG EC tablet, 1 tablet, Disp: , Rfl:    Biotin 5 MG CAPS, Take 5 mg by mouth daily., Disp: , Rfl:    Cyanocobalamin (B-12) 1000 MCG SUBL, 1 tablet, Disp: , Rfl:    EASY TOUCH SAFETY LANCETS 28G MISC, USE TO CHECK BLOOD SUGAR EVERY DAY FOR 90 DAYS AS DIRECTED, Disp: , Rfl: 3   metoprolol succinate (TOPROL-XL) 50 MG 24 hr tablet, 1 tablet, Disp: , Rfl:    ONE TOUCH ULTRA TEST test strip, 1 each by Other route 2 (two) times a week., Disp: , Rfl:    pantoprazole (PROTONIX) 40 MG tablet, Take 1 tablet by mouth daily., Disp: , Rfl:    rosuvastatin (CRESTOR) 20 MG tablet, 1/2 tablet, Disp: , Rfl:    Semaglutide,0.25 or 0.'5MG'$ /DOS, (OZEMPIC, 0.25 OR 0.5 MG/DOSE,) 2 MG/1.5ML  SOPN, Inject 0.5 mg into the skin once a week. , Disp: , Rfl:    sertraline (ZOLOFT) 100 MG tablet, Take 100 mg by mouth daily. , Disp: , Rfl:    sodium chloride (OCEAN) 0.65 % SOLN nasal spray, Place 1 spray into both nostrils daily., Disp: , Rfl:    sulfamethoxazole-trimethoprim (BACTRIM DS) 800-160 MG tablet, Take 1 tablet by mouth 2 (two) times daily. One PO BID x 3 days, Disp: 6 tablet, Rfl: 0   sulfamethoxazole-trimethoprim (BACTRIM DS) 800-160 MG tablet, Take 1 tablet by mouth 2 (two) times daily. One PO BID x 3 days, Disp: 6 tablet, Rfl: 0   telmisartan (MICARDIS) 80 MG tablet, Take 80 mg by mouth daily., Disp: , Rfl: 6   valACYclovir (VALTREX) 500 MG tablet, Take one tablet (500 mg) by mouth twice a day for 3 days for an outbreak.  Take one tablet my mouth daily for infection prevention., Disp: 110 tablet, Rfl: 3    Radiology:  No results found.  Cardiac Studies:   Exercise Treadmill Stress Test 02/11/2018: Indication:SOB The patient exercised on Bruce protocol for 07:35 min. Patient achieved 8.81 METS and reached HR 140 bpm, which is 91% of maximum age-predicted HR. Stress test terminated due to fatigue.  Exercise capacity was normal. HR Response to Exercise: Appropriate. BP Response to Exercise: Resting hypertension- exaggerated response, peak 202/90 mmHg Chest Pain: none. Arrhythmias: Occasional PVC 's. ST Changes: With peak exercise there was no ST-T changes of ischemia. Overall Impression: Normal stress test. Continue primary/secondary prevention.  Right and left heart catheterization 07/20/2020: Normal LV systolic function.  Severe aortic stenosis.  Peak to peak gradient 71 mmHg, mean gradient 61.1 mmHg.  Aortic valve area 0.91 cm. Normal coronary arteries, right dominant circulation. Normal right heart catheterization with normal pressures and preserved cardiac output and cardiac index.   Recommendation: Patient will need evaluation for aortic valve replacement.  60 mL  contrast utilized.  Outpatient extended EKG monitoring 2 weeks on 09/15/2020: Indication: Heart block Predominant rhythm is normal sinus rhythm.  1 run of ventricular tachycardia for 4 beats.  25 SVT, longest 3 minutes 32 seconds at rate of 121 bpm, suggestive of atrial tachycardia, occasional PVCs and PACs.  No heart block.  No atrial fibrillation.  Occasional rate related bundle branch block.  PCV ECHOCARDIOGRAM COMPLETE 08/29/2021  Narrative Echocardiogram 08/29/2021: Normal LV systolic function with visual EF 55-60%. Left ventricle cavity is normal in size. Mild left ventricular  hypertrophy, basal hypertrophy.  Normal global wall motion. Normal diastolic filling pattern, normal LAP. Bioprosthetic 26 mm Ultra Sapien valve, well seated and normal functioning.  No valvular stenosis or regurgitation. No paravalvular regurgitation. No other significant valvular abnormality. Compared to study 05/03/2021 no significant change.  Carotid artery duplex 01/18/2022: Duplex suggests stenosis in the right internal carotid artery (16-49%). Duplex suggests stenosis in the left internal carotid artery (16-49%). There is moderate heterogenous plaque noted in the bilateral carotid bulb and origin of CCA. Antegrade right vertebral artery flow. Antegrade left vertebral artery flow. Compared to 07/13/2021, there is mild regression of stenosis severity bilaterally. Follow up in one year is appropriate if clinically indicated.  EKG   EKG 01/24/2022: Sinus rhythm with first-degree block at rate of 64 bpm, left axis deviation, left anterior fascicular block.  Anteroseptal infarct old.  Nonspecific T abnormality.  Occasional PACs.  EKG 02/28/2021: Sinus rhythm with first-degree AV block at a rate of 55 bpm.  Left bundle branch block.  No further analysis.  Compared to EKG 09/15/2020, no significant change.  Assessment     ICD-10-CM   1. S/P TAVR (transcatheter aortic valve replacement)  Z95.2     2. Primary  hypertension  I10     3. Asymptomatic bilateral carotid artery stenosis  I65.23     4. Hypercholesteremia  E78.00        No orders of the defined types were placed in this encounter.  There are no discontinued medications.    No orders of the defined types were placed in this encounter.   Recommendations:   ADRITA MEYERHOFF  is a 72 y.o. Caucasian female with moderate aortic stenosis, hypertension, hyperlipidemia, asymptomatic bilateral carotid artery stenosis, diabetes mellitus, severe aortic stenosis for which he underwent TAVR with implantation of a 26 mm Edwards Sapien 3 THV 09/16/2020.  This is an annual visit.  1. S/P TAVR (transcatheter aortic valve replacement) ***  2. Primary hypertension ***  3. Asymptomatic bilateral carotid artery stenosis ***  4. Hypercholesteremia ***     Patient presents for 6 month follow up of hypertension, hyperlipidemia, bilateral carotid stenosis and TAVR.  She has lost about 15 pounds in weight and has noticed significant improvement in overall wellbeing, diabetes being treated by Ozempic.  She is essentially asymptomatic.  I reviewed her labs, lipids under excellent control.  Carotid duplex reveals mild regression of carotid artery stenosis severity.  She will need annual surveillance now.  Blood pressure is well controlled, report of the echocardiogram reviewed, TAVR stable.  We will repeat echocardiogram in a year along with her carotid artery duplex and see her back at that time.   Adrian Prows, PA-C 01/31/2023, 9:45 AM Office: [phone]
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
## 2023-02-19 DIAGNOSIS — I10 Essential (primary) hypertension: Secondary | ICD-10-CM | POA: Diagnosis not present

## 2023-02-19 DIAGNOSIS — E1159 Type 2 diabetes mellitus with other circulatory complications: Secondary | ICD-10-CM | POA: Diagnosis not present

## 2023-02-19 DIAGNOSIS — D509 Iron deficiency anemia, unspecified: Secondary | ICD-10-CM | POA: Diagnosis not present

## 2023-02-20 DIAGNOSIS — I6529 Occlusion and stenosis of unspecified carotid artery: Secondary | ICD-10-CM | POA: Diagnosis not present

## 2023-02-20 DIAGNOSIS — Z952 Presence of prosthetic heart valve: Secondary | ICD-10-CM | POA: Diagnosis not present

## 2023-02-20 DIAGNOSIS — I10 Essential (primary) hypertension: Secondary | ICD-10-CM | POA: Diagnosis not present

## 2023-02-20 DIAGNOSIS — E7800 Pure hypercholesterolemia, unspecified: Secondary | ICD-10-CM | POA: Diagnosis not present

## 2023-02-20 DIAGNOSIS — E1159 Type 2 diabetes mellitus with other circulatory complications: Secondary | ICD-10-CM | POA: Diagnosis not present

## 2023-02-21 ENCOUNTER — Encounter: Payer: Self-pay | Admitting: Obstetrics and Gynecology

## 2023-02-21 ENCOUNTER — Other Ambulatory Visit (HOSPITAL_COMMUNITY)
Admission: RE | Admit: 2023-02-21 | Discharge: 2023-02-21 | Disposition: A | Discharge status: Home / Self Care | Payer: Medicare HMO | Source: Ambulatory Visit | Attending: Obstetrics and Gynecology | Admitting: Obstetrics and Gynecology

## 2023-02-21 ENCOUNTER — Ambulatory Visit (INDEPENDENT_AMBULATORY_CARE_PROVIDER_SITE_OTHER): Payer: Medicare HMO | Admitting: Obstetrics and Gynecology

## 2023-02-21 VITALS — BP 122/78 | HR 78 | Ht 68.5 in | Wt 177.0 lb

## 2023-02-21 DIAGNOSIS — N952 Postmenopausal atrophic vaginitis: Secondary | ICD-10-CM | POA: Diagnosis not present

## 2023-02-21 DIAGNOSIS — Z9189 Other specified personal risk factors, not elsewhere classified: Secondary | ICD-10-CM

## 2023-02-21 DIAGNOSIS — R300 Dysuria: Secondary | ICD-10-CM

## 2023-02-21 DIAGNOSIS — N760 Acute vaginitis: Secondary | ICD-10-CM | POA: Diagnosis not present

## 2023-02-21 DIAGNOSIS — B009 Herpesviral infection, unspecified: Secondary | ICD-10-CM

## 2023-02-21 DIAGNOSIS — N9089 Other specified noninflammatory disorders of vulva and perineum: Secondary | ICD-10-CM

## 2023-02-21 LAB — URINALYSIS, COMPLETE W/RFL CULTURE
Bacteria, UA: NONE SEEN /HPF
Bilirubin Urine: NEGATIVE
Casts: NONE SEEN /LPF
Crystals: NONE SEEN /HPF
Glucose, UA: NEGATIVE
Hyaline Cast: NONE SEEN /LPF
Ketones, ur: NEGATIVE
Leukocyte Esterase: NEGATIVE
Nitrites, Initial: NEGATIVE
Protein, ur: NEGATIVE
RBC / HPF: NONE SEEN /HPF (ref 0–2)
Specific Gravity, Urine: 1.01 (ref 1.001–1.035)
WBC, UA: NONE SEEN /HPF (ref 0–5)
Yeast: NONE SEEN /HPF
pH: 5.5 (ref 5.0–8.0)

## 2023-02-21 LAB — WET PREP FOR TRICH, YEAST, CLUE

## 2023-02-21 LAB — NO CULTURE INDICATED

## 2023-02-21 MED ORDER — VALACYCLOVIR HCL 500 MG PO TABS: ORAL_TABLET | ORAL | 3 refills | Status: AC

## 2023-02-21 MED ORDER — ESTRADIOL 0.1 MG/GM VA CREA: TOPICAL_CREAM | VAGINAL | 2 refills | Status: DC

## 2023-02-21 NOTE — Patient Instructions (Signed)

## 2023-02-26 LAB — CYTOLOGY - PAP: Diagnosis: NEGATIVE
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
## 2023-03-05 ENCOUNTER — Ambulatory Visit: Payer: Medicare HMO

## 2023-03-05 DIAGNOSIS — Z952 Presence of prosthetic heart valve: Secondary | ICD-10-CM

## 2023-03-05 DIAGNOSIS — I6523 Occlusion and stenosis of bilateral carotid arteries: Secondary | ICD-10-CM | POA: Diagnosis not present

## 2023-03-05 DIAGNOSIS — I10 Essential (primary) hypertension: Secondary | ICD-10-CM

## 2023-03-12 ENCOUNTER — Ambulatory Visit: Payer: Medicare HMO | Admitting: Cardiology

## 2023-03-12 ENCOUNTER — Encounter: Payer: Self-pay | Admitting: Cardiology

## 2023-03-12 VITALS — BP 124/68 | HR 71 | Ht 68.5 in | Wt 175.0 lb

## 2023-03-12 DIAGNOSIS — E1165 Type 2 diabetes mellitus with hyperglycemia: Secondary | ICD-10-CM | POA: Diagnosis not present

## 2023-03-12 DIAGNOSIS — E7800 Pure hypercholesterolemia, unspecified: Secondary | ICD-10-CM | POA: Diagnosis not present

## 2023-03-12 DIAGNOSIS — I359 Nonrheumatic aortic valve disorder, unspecified: Secondary | ICD-10-CM | POA: Diagnosis not present

## 2023-03-12 DIAGNOSIS — I6523 Occlusion and stenosis of bilateral carotid arteries: Secondary | ICD-10-CM | POA: Diagnosis not present

## 2023-03-12 DIAGNOSIS — E559 Vitamin D deficiency, unspecified: Secondary | ICD-10-CM | POA: Diagnosis not present

## 2023-03-12 DIAGNOSIS — Z952 Presence of prosthetic heart valve: Secondary | ICD-10-CM

## 2023-03-12 DIAGNOSIS — I6529 Occlusion and stenosis of unspecified carotid artery: Secondary | ICD-10-CM | POA: Diagnosis not present

## 2023-03-12 DIAGNOSIS — I350 Nonrheumatic aortic (valve) stenosis: Secondary | ICD-10-CM | POA: Diagnosis not present

## 2023-03-12 DIAGNOSIS — K219 Gastro-esophageal reflux disease without esophagitis: Secondary | ICD-10-CM | POA: Diagnosis not present

## 2023-03-12 DIAGNOSIS — Z0000 Encounter for general adult medical examination without abnormal findings: Secondary | ICD-10-CM | POA: Diagnosis not present

## 2023-03-12 DIAGNOSIS — I10 Essential (primary) hypertension: Secondary | ICD-10-CM

## 2023-03-12 NOTE — Progress Notes (Signed)
Primary Physician/Referring:  Merri Brunette, MD  Patient ID: Mary Reed, female    DOB: [birth year], 72 y.o.   MRN: [id number]  Chief Complaint  Patient presents with   Hypertension   Follow-up    1 year f/u    HPI:    Mary Reed  is a 72 y.o. Caucasian female with moderate aortic stenosis, hypertension, hyperlipidemia, asymptomatic bilateral carotid artery stenosis, diabetes mellitus, severe aortic stenosis for which he underwent TAVR with implantation of a 26 mm Edwards Sapien 3 THV 09/16/2020.    Patient presents for 6 month follow up of hypertension, hyperlipidemia, bilateral carotid stenosis and TAVR.  She has lost about 15 pounds in weight and has noticed significant improvement in overall wellbeing.  She is essentially asymptomatic.   Past Medical History:  Diagnosis Date   Allergy    seasonal   Arthritis    Cancer    skin cancer (not melanoma)   DES exposure in utero    Diabetes mellitus without complication    GERD (gastroesophageal reflux disease)    Herpes    Hypertension    Severe aortic stenosis    STD (sexually transmitted disease)    HSV II   Urinary incontinence    with exercise/cough/laughing   Social History   Tobacco Use   Smoking status: Former    Types: Cigarettes    Quit date: 03/26/1984    Years since quitting: 38.9   Smokeless tobacco: Never  Substance Use Topics   Alcohol use: No    Alcohol/week: 0.0 standard drinks of alcohol   ROS  Review of Systems  Cardiovascular:  Negative for chest pain, dyspnea on exertion and leg swelling.   Objective  Blood pressure 124/68, pulse 71, height 5' 8.5" (1.74 m), weight 175 lb (79.4 kg), last menstrual period 11/20/2006, SpO2 98 %.     03/12/2023    2:10 PM 02/21/2023    9:58 AM 02/15/2022    2:19 PM  Vitals with BMI  Height 5' 8.5" 5' 8.5" 5' 7.5"  Weight 175 lbs 177 lbs 176 lbs  BMI 26.22 26.52 27.14  Systolic 124 122 782  Diastolic 68 78 78  Pulse 71 78 65    Physical  Exam Neck:     Vascular: Carotid bruit (left) present. No JVD.  Cardiovascular:     Rate and Rhythm: Normal rate and regular rhythm.     Pulses: Normal pulses and intact distal pulses.     Heart sounds: Normal heart sounds, S1 normal and S2 normal. No murmur heard.    No gallop.  Pulmonary:     Effort: Pulmonary effort is normal. No respiratory distress.     Breath sounds: No wheezing, rhonchi or rales.  Abdominal:     General: Bowel sounds are normal.     Palpations: Abdomen is soft.  Musculoskeletal:     Right lower leg: No edema.     Left lower leg: No edema.    Laboratory examination:   External labs:   Labs 01/08/2023:  BUN 14, creatinine 0.83, EGFR 70 mL, potassium 3.7.  LFTs normal.  A1c 8.0%.  Total cholesterol 145, triglycerides 119, HDL 48, LDL 75.  Labs 08/31/2021:  A1c 6.9%.  Total cholesterol 152, triglycerides 171, HDL 42, LDL 76.  Labs 02/23/2021:  Vitamin D 27.1.   Radiology:  No results found.  Cardiac Studies:   Right and left heart catheterization 07/20/2020: Normal LV systolic function.  Severe aortic stenosis.  Peak to peak gradient  71 mmHg, mean gradient 61.1 mmHg.  Aortic valve area 0.91 cm. Normal coronary arteries, right dominant circulation. Normal right heart catheterization with normal pressures and preserved cardiac output and cardiac index.   Recommendation: Patient will need evaluation for aortic valve replacement.  60 mL contrast utilized.  PCV ECHOCARDIOGRAM COMPLETE 03/05/2023  Narrative Echocardiogram 03/05/2023: Normal LV systolic function with visual EF 55-60%. Left ventricle cavity is normal in size. Normal left ventricular wall thickness. Normal global wall motion. Normal diastolic filling pattern, normal LAP. Calculated EF 59%. Bioprosthetic 26 mm Ultra Sapien valve, well seated and normal functioning. No aortic valve regurgitation. Structurally normal tricuspid valve with trace regurgitation. No evidence of pulmonary  hypertension. No significant change compared to 08/2021.  Carotid artery duplex 03/05/2023: Duplex suggests stenosis in the right internal carotid artery (50-69%). Duplex suggests stenosis in the left internal carotid artery (1-15%). There is moderate heterogenous plaque noted in the bilateral carotid bulb and origin of CCA. Antegrade right vertebral artery flow. Antegrade left vertebral artery flow. Mild decrease in left ICA severity compared to 01/18/2022. Follow up in six months is appropriate if clinically indicated.  EKG   EKG 03/12/2023: Sinus rhythm with 1st degree AV Block at the rate of 65 bpm, leftward axis, anteroseptal infarct old.  IVCD, borderline criteria for LVH.  Compared to 01/24/2022, no significant change.   Medications and allergies   Allergies  Allergen Reactions   Codeine Nausea And Vomiting and Other (See Comments)   Metformin Hcl Other (See Comments) and Diarrhea     Current Outpatient Medications:    amLODipine (NORVASC) 10 MG tablet, Take 1 tablet by mouth daily., Disp: , Rfl:    aspirin 81 MG EC tablet, 1 tablet, Disp: , Rfl:    Biotin 5 MG CAPS, Take 5 mg by mouth daily., Disp: , Rfl:    Cyanocobalamin (B-12) 1000 MCG SUBL, 1 tablet, Disp: , Rfl:    EASY TOUCH SAFETY LANCETS 28G MISC, USE TO CHECK BLOOD SUGAR EVERY DAY FOR 90 DAYS AS DIRECTED, Disp: , Rfl: 3   estradiol (ESTRACE) 0.1 MG/GM vaginal cream, Use 1/2 g vaginally every night for the first 2 weeks, then use 1/2 g vaginally two or three times per week as needed to maintain symptom relief., Disp: 42.5 g, Rfl: 2   meloxicam (MOBIC) 15 MG tablet, Take 15 mg by mouth daily., Disp: , Rfl:    metoprolol succinate (TOPROL-XL) 50 MG 24 hr tablet, 1 tablet, Disp: , Rfl:    ONE TOUCH ULTRA TEST test strip, 1 each by Other route 2 (two) times a week., Disp: , Rfl:    pantoprazole (PROTONIX) 40 MG tablet, Take 1 tablet by mouth daily., Disp: , Rfl:    rosuvastatin (CRESTOR) 20 MG tablet, 1/2 tablet, Disp: ,  Rfl:    Semaglutide, 2 MG/DOSE, (OZEMPIC, 2 MG/DOSE,) 8 MG/3ML SOPN, Inject 2 mg into the skin once a week., Disp: , Rfl:    sertraline (ZOLOFT) 100 MG tablet, Take 100 mg by mouth daily. , Disp: , Rfl:    sodium chloride (OCEAN) 0.65 % SOLN nasal spray, Place 1 spray into both nostrils daily., Disp: , Rfl:    telmisartan (MICARDIS) 80 MG tablet, Take 80 mg by mouth daily., Disp: , Rfl: 6   valACYclovir (VALTREX) 500 MG tablet, Take one tablet (500 mg) by mouth twice a day for 3 days for an outbreak.  Take one tablet my mouth daily for infection prevention., Disp: 110 tablet, Rfl: 3   Assessment  ICD-10-CM   1. Primary hypertension  I10 EKG 12-Lead    2. Asymptomatic bilateral carotid artery stenosis  I65.23 PCV CAROTID DUPLEX (BILATERAL)    3. S/P TAVR (transcatheter aortic valve replacement)  Z95.2 EKG 12-Lead    PCV ECHOCARDIOGRAM COMPLETE    4. Aortic valve disorder  I35.9 PCV ECHOCARDIOGRAM COMPLETE       No orders of the defined types were placed in this encounter.  Medications Discontinued During This Encounter  Medication Reason   Semaglutide,0.25 or 0.5MG /DOS, (OZEMPIC, 0.25 OR 0.5 MG/DOSE,) 2 MG/1.5ML SOPN Dose change      Orders Placed This Encounter  Procedures   EKG 12-Lead   PCV ECHOCARDIOGRAM COMPLETE    Standing Status:   Future    Standing Expiration Date:   03/11/2024    Recommendations:   LAYLIANA DEVINS  is a 72 y.o. Caucasian female with moderate aortic stenosis, hypertension, hyperlipidemia, asymptomatic bilateral carotid artery stenosis, diabetes mellitus, severe aortic stenosis for which she underwent TAVR with implantation of a 26 mm Edwards Sapien 3 THV 09/16/2020.    1. Primary hypertension Blood pressure Controlled, no changes in the medications were done today. - EKG 12-Lead  2. Asymptomatic bilateral carotid artery stenosis Left carotid stenosis appears to be slightly improved, no significant change of right carotid stenosis, continue 6  monthly surveillance for now.  Continue statins, external labs reviewed, lipids under excellent control. - PCV CAROTID DUPLEX (BILATERAL); Future  3. S/P TAVR (transcatheter aortic valve replacement) Reviewed echocardiogram, she will need annual echocardiogram in view of TAVR.  Valve is functioning normally. - EKG 12-Lead - PCV ECHOCARDIOGRAM COMPLETE; Future  4. Aortic valve disorder  - PCV ECHOCARDIOGRAM COMPLETE; Future  Will continue to 6 monthly surveillance of carotid artery stenosis, echocardiogram in a year, I will see her back in a year or sooner if problems.     Mary Decamp, PA-C 03/12/2023, 2:51 PM Office: [phone]

## 2023-03-14 ENCOUNTER — Other Ambulatory Visit (HOSPITAL_COMMUNITY)
Admission: RE | Admit: 2023-03-14 | Discharge: 2023-03-14 | Disposition: A | Discharge status: Home / Self Care | Payer: Medicare HMO | Source: Ambulatory Visit | Attending: Obstetrics and Gynecology | Admitting: Obstetrics and Gynecology

## 2023-03-14 ENCOUNTER — Encounter: Payer: Self-pay | Admitting: Obstetrics and Gynecology

## 2023-03-14 ENCOUNTER — Ambulatory Visit: Payer: Medicare HMO | Admitting: Obstetrics and Gynecology

## 2023-03-14 ENCOUNTER — Encounter: Payer: Self-pay | Admitting: Cardiology

## 2023-03-14 DIAGNOSIS — N9089 Other specified noninflammatory disorders of vulva and perineum: Secondary | ICD-10-CM

## 2023-03-14 NOTE — Progress Notes (Signed)
Labs 02/19/2023  Hb 13.1/HCT 38.4, platelets 184.  BUN 17, creatinine 0.82, serum glucose 110 mg, LFTs normal, potassium 4.3 EGFR 82 mL, LFTs normal.  A1c 7.0%.  Total cholesterol 146, triglycerides 115, HDL 47, LDL 78.

## 2023-03-14 NOTE — Patient Instructions (Signed)
Vulva Biopsy, Care After The following information offers guidance on how to care for yourself after your procedure. Your health care provider may also give you more specific instructions. If you have problems or questions, contact your health care provider. What can I expect after the procedure? After the procedure, it is common to have: Slight bleeding from the biopsy site. Slight pain or discomfort at the biopsy site. Follow these instructions at home: Biopsy site care  Follow instructions from your health care provider about how to take care of your biopsy site. Make sure you: Clean the area using water and mild soap twice a day or as told by your health care provider. Gently pat the area dry. You may shower 24 hours after the procedure. If you were prescribed an antibiotic ointment, apply it as told by your health care provider. Do not stop using the antibiotic even if your condition improves. If told by your health care provider, take a sitz bath to help with pain and discomfort. This is a warm water bath that you take while sitting down. Do this as often as told by your health care provider. The water should only come up to your hips and cover your buttocks. You may pat the area dry with a soft, clean towel. Leave stitches (sutures), skin glue, or adhesive strips in place. These skin closures may need to stay in place for 2 weeks or longer. If adhesive strip edges start to loosen and curl up, you may trim the loose edges. Do not remove adhesive strips completely unless your health care provider tells you to do that. Check your biopsy site every day for signs of infection. It may be helpful to use a handheld mirror to do this. Check for: Redness, swelling, or more pain. More fluid or blood. Warmth. Pus or a bad smell. Do not rub the biopsy area after urinating. Gently pat the area dry or use a bottle filled with warm water (peri bottle) to clean the area. Gently wipe from front to  back. Lifestyle Wear loose, cotton underwear. Do not wear tight pants. For at least 1 week or until your health care provider approves: Do not use tampons, douche, or put anything inside your vagina. Do not have sex. Until your health care provider approves: Do not exercise, such as running or biking. Do not swim or use a hot tub. General instructions Take over-the-counter and prescription medicines only as told by your health care provider. Drink enough fluid to keep your urine pale yellow. Use a sanitary napkin until the bleeding stops. If told, put ice on the biopsy site. To do this: Place ice in a plastic bag. Place a towel between your skin and the bag. Leave the ice on for 20 minutes, 2-3 times a day. Remove the ice if your skin turns bright red. This is very important. If you cannot feel pain, heat, or cold, you have a greater risk of damage to the area. Keep all follow-up visits. This is important. Contact a health care provider if: You have redness, swelling, or more pain around your biopsy site. You have more fluid or blood coming from your biopsy site. Your biopsy site feels warm to the touch. Your pain is not controlled with medicine or ice packs. You have a fever or chills. Get help right away if: You have heavy bleeding from the vulva. You have pus or a bad smell coming from the biopsy site. You have abdominal pain. Summary After the procedure, it   is common to have slight bleeding and discomfort at the biopsy site. Follow instructions from your health care provider after your biopsy. Take sitz baths as told by your health care provider to help with pain and discomfort. Leave any sutures in place. Contact your health care provider if you notice any signs of infection around the biopsy site, including redness, swelling, more pain, more fluid or blood, or warmth. Keep all follow-up visits. This is important. This information is not intended to replace advice given to you  by your health care provider. Make sure you discuss any questions you have with your health care provider. Document Revised: 07/26/2021 Document Reviewed: 07/26/2021 Elsevier Patient Education  2023 [employer]  

## 2023-03-15 LAB — SURGICAL PATHOLOGY

## 2023-03-20 ENCOUNTER — Other Ambulatory Visit: Payer: Self-pay

## 2023-03-20 MED ORDER — CLOBETASOL PROPIONATE 0.05 % EX OINT: TOPICAL_OINTMENT | CUTANEOUS | 1 refills | Status: AC

## 2023-05-02 DIAGNOSIS — E1142 Type 2 diabetes mellitus with diabetic polyneuropathy: Secondary | ICD-10-CM | POA: Diagnosis not present

## 2023-05-02 DIAGNOSIS — R0989 Other specified symptoms and signs involving the circulatory and respiratory systems: Secondary | ICD-10-CM | POA: Diagnosis not present

## 2023-05-02 DIAGNOSIS — D509 Iron deficiency anemia, unspecified: Secondary | ICD-10-CM | POA: Diagnosis not present

## 2023-05-02 DIAGNOSIS — I776 Arteritis, unspecified: Secondary | ICD-10-CM | POA: Diagnosis not present

## 2023-05-02 DIAGNOSIS — I6529 Occlusion and stenosis of unspecified carotid artery: Secondary | ICD-10-CM | POA: Diagnosis not present

## 2023-05-15 DIAGNOSIS — R0989 Other specified symptoms and signs involving the circulatory and respiratory systems: Secondary | ICD-10-CM | POA: Diagnosis not present

## 2023-05-16 DIAGNOSIS — R0989 Other specified symptoms and signs involving the circulatory and respiratory systems: Secondary | ICD-10-CM | POA: Diagnosis not present

## 2023-05-30 DIAGNOSIS — E1159 Type 2 diabetes mellitus with other circulatory complications: Secondary | ICD-10-CM | POA: Diagnosis not present

## 2023-05-30 DIAGNOSIS — I10 Essential (primary) hypertension: Secondary | ICD-10-CM | POA: Diagnosis not present

## 2023-05-30 DIAGNOSIS — E7800 Pure hypercholesterolemia, unspecified: Secondary | ICD-10-CM | POA: Diagnosis not present

## 2023-07-18 DIAGNOSIS — H5203 Hypermetropia, bilateral: Secondary | ICD-10-CM | POA: Diagnosis not present

## 2023-07-18 DIAGNOSIS — H25042 Posterior subcapsular polar age-related cataract, left eye: Secondary | ICD-10-CM | POA: Diagnosis not present

## 2023-07-18 DIAGNOSIS — H2513 Age-related nuclear cataract, bilateral: Secondary | ICD-10-CM | POA: Diagnosis not present

## 2023-07-18 DIAGNOSIS — E119 Type 2 diabetes mellitus without complications: Secondary | ICD-10-CM | POA: Diagnosis not present

## 2023-07-18 DIAGNOSIS — H25013 Cortical age-related cataract, bilateral: Secondary | ICD-10-CM | POA: Diagnosis not present

## 2023-08-15 DIAGNOSIS — I10 Essential (primary) hypertension: Secondary | ICD-10-CM | POA: Diagnosis not present

## 2023-08-15 DIAGNOSIS — E7800 Pure hypercholesterolemia, unspecified: Secondary | ICD-10-CM | POA: Diagnosis not present

## 2023-08-15 DIAGNOSIS — E1159 Type 2 diabetes mellitus with other circulatory complications: Secondary | ICD-10-CM | POA: Diagnosis not present

## 2023-08-20 DIAGNOSIS — Z1231 Encounter for screening mammogram for malignant neoplasm of breast: Secondary | ICD-10-CM | POA: Diagnosis not present

## 2023-08-21 ENCOUNTER — Encounter: Payer: Self-pay | Admitting: Obstetrics and Gynecology

## 2023-09-03 DIAGNOSIS — E1165 Type 2 diabetes mellitus with hyperglycemia: Secondary | ICD-10-CM | POA: Diagnosis not present

## 2023-09-04 LAB — LAB REPORT - SCANNED
A1c: 6.5
EGFR: 72

## 2023-09-10 ENCOUNTER — Other Ambulatory Visit: Payer: Medicare HMO

## 2023-09-17 DIAGNOSIS — E1159 Type 2 diabetes mellitus with other circulatory complications: Secondary | ICD-10-CM | POA: Diagnosis not present

## 2023-09-17 DIAGNOSIS — E559 Vitamin D deficiency, unspecified: Secondary | ICD-10-CM | POA: Diagnosis not present

## 2023-09-17 DIAGNOSIS — E1142 Type 2 diabetes mellitus with diabetic polyneuropathy: Secondary | ICD-10-CM | POA: Diagnosis not present

## 2023-09-17 DIAGNOSIS — I10 Essential (primary) hypertension: Secondary | ICD-10-CM | POA: Diagnosis not present

## 2023-09-17 DIAGNOSIS — E7800 Pure hypercholesterolemia, unspecified: Secondary | ICD-10-CM | POA: Diagnosis not present

## 2023-09-19 ENCOUNTER — Encounter (HOSPITAL_COMMUNITY): Payer: Medicare HMO

## 2023-09-24 ENCOUNTER — Ambulatory Visit (HOSPITAL_COMMUNITY)
Admission: RE | Admit: 2023-09-24 | Discharge: 2023-09-24 | Disposition: A | Discharge status: Home / Self Care | Payer: Medicare HMO | Source: Ambulatory Visit | Attending: Cardiology | Admitting: Cardiology

## 2023-09-24 DIAGNOSIS — I6523 Occlusion and stenosis of bilateral carotid arteries: Secondary | ICD-10-CM | POA: Insufficient documentation

## 2023-10-02 DIAGNOSIS — H2512 Age-related nuclear cataract, left eye: Secondary | ICD-10-CM | POA: Diagnosis not present

## 2023-10-02 DIAGNOSIS — Z961 Presence of intraocular lens: Secondary | ICD-10-CM | POA: Diagnosis not present

## 2023-10-02 DIAGNOSIS — H25812 Combined forms of age-related cataract, left eye: Secondary | ICD-10-CM | POA: Diagnosis not present

## 2023-10-02 DIAGNOSIS — H25012 Cortical age-related cataract, left eye: Secondary | ICD-10-CM | POA: Diagnosis not present

## 2023-10-02 DIAGNOSIS — H25042 Posterior subcapsular polar age-related cataract, left eye: Secondary | ICD-10-CM | POA: Diagnosis not present

## 2023-10-02 NOTE — Progress Notes (Signed)
Carotid artery duplex 09/24/2023: Right Carotid: Velocities in the right ICA are consistent with a high end range  1-39% stenosis.  Left Carotid: Velocities in the left ICA are consistent with a 1-39% stenosis. Vertebrals:  Bilateral vertebral arteries demonstrate antegrade flow. Subclavians: Normal flow hemodynamics were seen in bilateral subclavian arteries. Compared to 03/05/2023, right ICA stenosis of 50 to 69% not present.  Further studies if clinically indicated

## 2023-11-06 ENCOUNTER — Telehealth: Payer: Self-pay | Admitting: Cardiology

## 2023-11-06 NOTE — Telephone Encounter (Signed)
I spoke with patient and reviewed carotid doppler results with her.  Patient aware of appointments in April--echo and Dr Jacinto Halim.  No other appointments needed prior to these appointments.

## 2023-11-06 NOTE — Telephone Encounter (Signed)
Patient calling in regards to her ultrasound that was done on 09/24/23. Seeing if the daughter wants or need to see her prior to April. Please advise

## 2023-11-27 DIAGNOSIS — E1159 Type 2 diabetes mellitus with other circulatory complications: Secondary | ICD-10-CM | POA: Diagnosis not present

## 2023-11-27 DIAGNOSIS — E7800 Pure hypercholesterolemia, unspecified: Secondary | ICD-10-CM | POA: Diagnosis not present

## 2023-11-27 DIAGNOSIS — I10 Essential (primary) hypertension: Secondary | ICD-10-CM | POA: Diagnosis not present

## 2024-03-03 ENCOUNTER — Other Ambulatory Visit: Payer: Medicare HMO

## 2024-03-03 ENCOUNTER — Encounter: Payer: Self-pay | Admitting: Cardiology

## 2024-03-03 ENCOUNTER — Ambulatory Visit (HOSPITAL_COMMUNITY): Payer: Medicare HMO | Attending: Cardiology

## 2024-03-03 DIAGNOSIS — I359 Nonrheumatic aortic valve disorder, unspecified: Secondary | ICD-10-CM | POA: Insufficient documentation

## 2024-03-03 DIAGNOSIS — Z952 Presence of prosthetic heart valve: Secondary | ICD-10-CM | POA: Diagnosis not present

## 2024-03-03 LAB — ECHOCARDIOGRAM COMPLETE
AR max vel: 2.09 cm2
AV Area VTI: 2.01 cm2
AV Area mean vel: 2.01 cm2
AV Mean grad: 8 mmHg
AV Peak grad: 14.1 mmHg
Ao pk vel: 1.88 m/s
Area-P 1/2: 2.6 cm2
S' Lateral: 2.6 cm

## 2024-03-03 NOTE — Progress Notes (Signed)
 Prosthetic aortic valve is functioning normal, normal heart function as well.

## 2024-03-10 ENCOUNTER — Encounter: Payer: Self-pay | Admitting: Cardiology

## 2024-03-10 ENCOUNTER — Ambulatory Visit: Payer: Medicare HMO | Attending: Cardiology | Admitting: Cardiology

## 2024-03-10 ENCOUNTER — Ambulatory Visit: Payer: Medicare HMO | Admitting: Cardiology

## 2024-03-10 VITALS — BP 130/78 | HR 62 | Ht 69.0 in | Wt 179.0 lb

## 2024-03-10 DIAGNOSIS — I6523 Occlusion and stenosis of bilateral carotid arteries: Secondary | ICD-10-CM

## 2024-03-10 DIAGNOSIS — I10 Essential (primary) hypertension: Secondary | ICD-10-CM | POA: Diagnosis not present

## 2024-03-10 DIAGNOSIS — E559 Vitamin D deficiency, unspecified: Secondary | ICD-10-CM | POA: Diagnosis not present

## 2024-03-10 DIAGNOSIS — E7800 Pure hypercholesterolemia, unspecified: Secondary | ICD-10-CM | POA: Diagnosis not present

## 2024-03-10 DIAGNOSIS — Z0000 Encounter for general adult medical examination without abnormal findings: Secondary | ICD-10-CM | POA: Diagnosis not present

## 2024-03-10 DIAGNOSIS — Z952 Presence of prosthetic heart valve: Secondary | ICD-10-CM

## 2024-03-10 DIAGNOSIS — E118 Type 2 diabetes mellitus with unspecified complications: Secondary | ICD-10-CM | POA: Diagnosis not present

## 2024-03-10 LAB — LAB REPORT - SCANNED
A1c: 7.7
EGFR: 68

## 2024-03-10 NOTE — Progress Notes (Signed)
 Cardiology Office Note:  .   Date:  03/10/2024  ID:  Mary Reed, DOB [birth year], MRN [id number] PCP: Imelda Man, MD  [facility] Providers Cardiologist:  Knox Perl, MD   History of Present Illness: .    Discussed the use of AI scribe software for clinical note transcription with the patient, who gave verbal consent to proceed.  History of Present Illness The patient presents for a yearly visit. She had an echocardiogram last week, which showed a normal global strain and aortic valve. The gradients were very low and normal, and the valve area was two centimeters squared. The heart function is completely normal. The patient also had a carotid artery duplex in November 2024 due to mild plaque in her carotid arteries, which showed very mild plaque. The patient's serum glucose and labs from her family doctor are all looking great. The patient is a diabetic and takes Ozempic once a week. She also takes Crestor  (rosuvastatin ) 20 milligrams for slightly high cholesterol. The patient has been experiencing numbness in both ankles, which is worse at night after standing all day at work. The patient also takes tamsartan 80 milligrams once a day, metoprolol  succinate 50 milligrams once a day, and amlodipine  10 milligrams for high blood pressure.  Mary Reed is a 73 y.o. Caucasian female with  hypertension, hyperlipidemia, diabetes mellitus type 2,  asymptomatic bilateral carotid artery stenosis, diabetes mellitus, severe aortic stenosis for which he underwent TAVR with implantation of a 26 mm Edwards Sapien 3 THV 09/16/2020.  This is annual visit.  Labs   External Labs:  Labs 09/03/2023 on Care Everywhere:  Serum  Glucose 111 mg, BUN 8, creatinine 0.85, EGFR 72 mL, potassium 4.2.  Total cholesterol 152, triglycerides 134, HDL 49, LDL 79.  A1c 6.5%.  Review of Systems  Cardiovascular:  Negative for chest pain, dyspnea on exertion and leg swelling.   Physical Exam:   VS:   BP 130/78   Pulse 62   Ht 5\' 9"  (1.753 m)   Wt 179 lb (81.2 kg)   LMP 11/20/2006 (Approximate)   SpO2 95%   BMI 26.43 kg/m    Wt Readings from Last 3 Encounters:  03/10/24 179 lb (81.2 kg)  03/14/23 174 lb (78.9 kg)  03/12/23 175 lb (79.4 kg)    Physical Exam Neck:     Vascular: Carotid bruit (soft conducted sounds from AV) present. No JVD.  Cardiovascular:     Rate and Rhythm: Normal rate and regular rhythm.     Pulses: Intact distal pulses.     Heart sounds: S1 normal and S2 normal. Murmur heard.     Early systolic murmur is present with a grade of 2/6 at the upper right sternal border radiating to the neck.     No gallop.  Pulmonary:     Effort: Pulmonary effort is normal.     Breath sounds: Normal breath sounds.  Abdominal:     General: Bowel sounds are normal.     Palpations: Abdomen is soft.  Musculoskeletal:     Right lower leg: No edema.     Left lower leg: No edema.    Studies Reviewed: .    Carotid artery duplex 09/24/2023: Right Carotid: Velocities in the right ICA are consistent with a high end range  1-39% stenosis. Left Carotid: Velocities in the left ICA are consistent with a 1-39% stenosis. Vertebrals:  Bilateral vertebral arteries demonstrate antegrade flow. Subclavians: Normal flow hemodynamics were seen in bilateral subclavian arteries.  Compared to 03/05/2023, right ICA stenosis of 50 to 69% not present.   ECHOCARDIOGRAM COMPLETE 03/03/2024  1. Left ventricular ejection fraction, by estimation, is 60 to 65%. Left ventricular ejection fraction by 3D volume is 62 %. The left ventricle has normal function. The left ventricle has no regional wall motion abnormalities. There is moderate concentric left ventricular hypertrophy. Left ventricular diastolic parameters are consistent with Grade I diastolic dysfunction (impaired relaxation). The average left ventricular global longitudinal strain is -22.6 %. The global longitudinal strain is normal. 2. Right  ventricular systolic function is normal. The right ventricular size is normal. 3. The mitral valve is normal in structure. No evidence of mitral valve regurgitation. No evidence of mitral stenosis. 4. The aortic valve has been repaired/replaced. Aortic valve regurgitation is not visualized. No aortic stenosis is present. Echo findings are consistent with normal structure and function of the aortic valve prosthesis. Aortic valve area, by VTI measures 2.01 cm. Aortic valve mean gradient measures 8.0 mmHg. Aortic valve Vmax measures 1.88 m/s. 5. The inferior vena cava is normal in size with greater than 50% respiratory variability, suggesting right atrial pressure of 3 mmHg.  EKG:    EKG Interpretation Date/Time:  Monday March 10 2024 11:14:14 EDT Ventricular Rate:  77 PR Interval:  192 QRS Duration:  108 QT Interval:  400 QTC Calculation: 452 R Axis:   -50  Text Interpretation: EKG 03/10/2024: Sinus rhythm with first-degree AV block at rate of 77 bpm, left anterior fascicular block.  Anteroseptal infarct old.  No change from 09/08/2020 except first-degree AV block is new. Confirmed by Nakota Elsen, Jagadeesh ([id number]) on 03/10/2024 11:41:08 AM    Medications and allergies    Allergies  Allergen Reactions   Codeine Nausea And Vomiting and Other (See Comments)   Metformin Hcl Other (See Comments) and Diarrhea     Current Outpatient Medications:    amLODipine  (NORVASC ) 10 MG tablet, Take 1 tablet by mouth daily., Disp: , Rfl:    aspirin  81 MG EC tablet, Take 81 mg by mouth daily., Disp: , Rfl:    Biotin 5 MG CAPS, Take 5 mg by mouth daily., Disp: , Rfl:    clobetasol  ointment (TEMOVATE ) 0.05 %, Apply in a thin layer to affected area twice daily for 2 weeks for a flare and then apply twice weekly at bedtime for maintenance dosing as needed., Disp: 30 g, Rfl: 1   Cyanocobalamin  (B-12) 1000 MCG SUBL, Take 1,000 mcg by mouth daily., Disp: , Rfl:    EASY TOUCH SAFETY LANCETS 28G MISC, USE TO CHECK  BLOOD SUGAR EVERY DAY FOR 90 DAYS AS DIRECTED, Disp: , Rfl: 3   estradiol  (ESTRACE ) 0.1 MG/GM vaginal cream, Use 1/2 g vaginally every night for the first 2 weeks, then use 1/2 g vaginally two or three times per week as needed to maintain symptom relief., Disp: 42.5 g, Rfl: 2   meloxicam (MOBIC) 15 MG tablet, Take 15 mg by mouth daily., Disp: , Rfl:    metoprolol  succinate (TOPROL -XL) 50 MG 24 hr tablet, Take 50 mg by mouth daily., Disp: , Rfl:    ONE TOUCH ULTRA TEST test strip, 1 each by Other route 2 (two) times a week., Disp: , Rfl:    pantoprazole  (PROTONIX ) 40 MG tablet, Take 1 tablet by mouth daily., Disp: , Rfl:    rosuvastatin  (CRESTOR ) 20 MG tablet, Take 10 mg by mouth daily., Disp: , Rfl:    Semaglutide, 2 MG/DOSE, (OZEMPIC, 2 MG/DOSE,) 8 MG/3ML SOPN, Inject 2  mg into the skin once a week., Disp: , Rfl:    sertraline  (ZOLOFT ) 100 MG tablet, Take 100 mg by mouth daily. , Disp: , Rfl:    sodium chloride  (OCEAN) 0.65 % SOLN nasal spray, Place 1 spray into both nostrils daily., Disp: , Rfl:    telmisartan (MICARDIS) 80 MG tablet, Take 80 mg by mouth daily., Disp: , Rfl: 6   valACYclovir  (VALTREX ) 500 MG tablet, Take one tablet (500 mg) by mouth twice a day for 3 days for an outbreak.  Take one tablet my mouth daily for infection prevention., Disp: 110 tablet, Rfl: 3   No orders of the defined types were placed in this encounter.    There are no discontinued medications.   ASSESSMENT AND PLAN: .      ICD-10-CM   1. Primary hypertension  I10 EKG 12-Lead    2. S/P TAVR (transcatheter aortic valve replacement): 26 mm Edwards Sapien 3 THV 09/16/2020  Z95.2 EKG 12-Lead    3. Asymptomatic bilateral carotid artery stenosis  I65.23 EKG 12-Lead    4. Hypercholesteremia  E78.00       Assessment and Plan Assessment & Plan Aortic valve replacement (TAVR)   The echocardiogram indicates the aortic valve replacement from 2021 is functioning well with a valve area of 2 cm, low gradients, and  normal heart function.  Hypertension   Hypertension is well-controlled with telmisartan 80 mg, metoprolol  succinate 50 mg, and amlodipine  10 mg. Blood pressure is 130/78 mmHg. Amlodipine  may contribute to leg swelling.  Type 2 Diabetes Mellitus   Type 2 diabetes is managed with Ozempic. Emphasized the importance of eating slowly and considering smaller portions to enhance Ozempic's effectiveness for weight management and glycemic control, as it signals satiety to the brain.  Hypercholesterolemia   LDL cholesterol is 79 mg/dL, slightly above the target of less than 70 mg/dL for diabetic patients. Discussed dietary changes and exercise to reduce LDL levels. If LDL remains elevated, consider adding Zetia 10 mg to rosuvastatin  20 mg. Emphasized the importance of achieving target LDL levels due to diabetes.  Asymptomatic carotid stenosis   Mild rotted atherosclerosis by ultrasound noted in November 2024. No further workup needed at this time. LDL cholesterol should be less than 70 mg/dL due to diabetes.  Peripheral neuropathy   She reports numbness in both ankles, worse at night and after standing all day. Previous ultrasound of legs showed no circulation issues. Differential diagnosis includes peripheral neuropathy possibly related to diabetes. Discuss symptoms with primary care provider for further evaluation.   Signed,  Knox Perl, MD, [facility] 03/10/2024, 11:41 AM [facility] [address] [place], [place] [address] Phone: [phone]. Fax:  [phone]

## 2024-03-10 NOTE — Patient Instructions (Signed)
 Medication Instructions:  Your physician recommends that you continue on your current medications as directed. Please refer to the Current Medication list given to you today.  *If you need a refill on your cardiac medications before your next appointment, please call your pharmacy*  Lab Work: none If you have labs (blood work) drawn today and your tests are completely normal, you will receive your results only by: MyChart Message (if you have MyChart) OR A paper copy in the mail If you have any lab test that is abnormal or we need to change your treatment, we will call you to review the results.  Testing/Procedures: none  Follow-Up: At [facility], you and your health needs are our priority.  As part of our continuing mission to provide you with exceptional heart care, our providers are all part of one team.  This team includes your primary Cardiologist (physician) and Advanced Practice Providers or APPs (Physician Assistants and Nurse Practitioners) who all work together to provide you with the care you need, when you need it.  Your next appointment:   12 month(s)  Provider:   Yates Decamp, MD     We recommend signing up for the patient portal called "MyChart".  Sign up information is provided on this After Visit Summary.  MyChart is used to connect with patients for Virtual Visits (Telemedicine).  Patients are able to view lab/test results, encounter notes, upcoming appointments, etc.  Non-urgent messages can be sent to your provider as well.   To learn more about what you can do with MyChart, go to [email or web address].   Other Instructions        1st Floor: - Lobby - Registration  - Pharmacy  - Lab - Cafe  2nd Floor: - PV Lab - Diagnostic Testing (echo, CT, nuclear med)  3rd Floor: - Vacant  4th Floor: - TCTS (cardiothoracic surgery) - AFib Clinic - Structural Heart Clinic - Vascular Surgery  - Vascular Ultrasound  5th Floor: - [facility]  (general and EP) - Clinical Pharmacy for coumadin, hypertension, lipid, weight-loss medications, and med management appointments    Valet parking services will be available as well.

## 2024-03-11 DIAGNOSIS — H25011 Cortical age-related cataract, right eye: Secondary | ICD-10-CM | POA: Diagnosis not present

## 2024-03-11 DIAGNOSIS — H2511 Age-related nuclear cataract, right eye: Secondary | ICD-10-CM | POA: Diagnosis not present

## 2024-03-11 DIAGNOSIS — H25811 Combined forms of age-related cataract, right eye: Secondary | ICD-10-CM | POA: Diagnosis not present

## 2024-03-11 DIAGNOSIS — Z961 Presence of intraocular lens: Secondary | ICD-10-CM | POA: Diagnosis not present

## 2024-03-17 DIAGNOSIS — Z0000 Encounter for general adult medical examination without abnormal findings: Secondary | ICD-10-CM | POA: Diagnosis not present

## 2024-03-17 DIAGNOSIS — I10 Essential (primary) hypertension: Secondary | ICD-10-CM | POA: Diagnosis not present

## 2024-03-17 DIAGNOSIS — I6529 Occlusion and stenosis of unspecified carotid artery: Secondary | ICD-10-CM | POA: Diagnosis not present

## 2024-03-17 DIAGNOSIS — E1142 Type 2 diabetes mellitus with diabetic polyneuropathy: Secondary | ICD-10-CM | POA: Diagnosis not present

## 2024-03-17 DIAGNOSIS — E559 Vitamin D deficiency, unspecified: Secondary | ICD-10-CM | POA: Diagnosis not present

## 2024-03-17 DIAGNOSIS — E7800 Pure hypercholesterolemia, unspecified: Secondary | ICD-10-CM | POA: Diagnosis not present

## 2024-04-07 ENCOUNTER — Ambulatory Visit: Admitting: Obstetrics and Gynecology

## 2024-04-11 ENCOUNTER — Other Ambulatory Visit: Payer: Self-pay

## 2024-04-11 MED ORDER — ESTRADIOL 0.1 MG/GM VA CREA: TOPICAL_CREAM | VAGINAL | 1 refills | Status: AC

## 2024-04-11 NOTE — Telephone Encounter (Signed)
 Medication refill request: estradiol  0.01% cream Last AEX:  02-21-23 Next AEX: 08-18-24 Last MMG (if hormonal medication request): 08-20-23 birads 1:neg Refill authorized: please approve if appropriate

## 2024-08-08 DIAGNOSIS — M25362 Other instability, left knee: Secondary | ICD-10-CM | POA: Diagnosis not present

## 2024-08-08 DIAGNOSIS — M25562 Pain in left knee: Secondary | ICD-10-CM | POA: Diagnosis not present

## 2024-08-18 ENCOUNTER — Ambulatory Visit: Payer: Self-pay | Admitting: Obstetrics and Gynecology
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
## 2024-08-25 DIAGNOSIS — Z1231 Encounter for screening mammogram for malignant neoplasm of breast: Secondary | ICD-10-CM | POA: Diagnosis not present

## 2024-08-25 LAB — HM MAMMOGRAPHY

## 2024-08-26 ENCOUNTER — Ambulatory Visit: Payer: Self-pay | Admitting: Obstetrics and Gynecology

## 2024-09-03 DIAGNOSIS — Z23 Encounter for immunization: Secondary | ICD-10-CM | POA: Diagnosis not present

## 2024-09-03 DIAGNOSIS — E1159 Type 2 diabetes mellitus with other circulatory complications: Secondary | ICD-10-CM | POA: Diagnosis not present

## 2024-09-03 DIAGNOSIS — E1142 Type 2 diabetes mellitus with diabetic polyneuropathy: Secondary | ICD-10-CM | POA: Diagnosis not present

## 2024-09-03 DIAGNOSIS — Z79899 Other long term (current) drug therapy: Secondary | ICD-10-CM | POA: Diagnosis not present

## 2024-09-03 DIAGNOSIS — E7800 Pure hypercholesterolemia, unspecified: Secondary | ICD-10-CM | POA: Diagnosis not present

## 2024-09-03 DIAGNOSIS — I10 Essential (primary) hypertension: Secondary | ICD-10-CM | POA: Diagnosis not present

## 2024-09-03 LAB — LAB REPORT - SCANNED
A1c: 7.2
Albumin, Urine POC: 14
Creatinine, POC: 149.5 mg/dL
EGFR: 88
Microalb Creat Ratio: 9

## 2024-09-15 DIAGNOSIS — E1142 Type 2 diabetes mellitus with diabetic polyneuropathy: Secondary | ICD-10-CM | POA: Diagnosis not present

## 2024-09-15 DIAGNOSIS — I10 Essential (primary) hypertension: Secondary | ICD-10-CM | POA: Diagnosis not present

## 2024-09-15 DIAGNOSIS — Z952 Presence of prosthetic heart valve: Secondary | ICD-10-CM | POA: Diagnosis not present

## 2024-09-15 DIAGNOSIS — E559 Vitamin D deficiency, unspecified: Secondary | ICD-10-CM | POA: Diagnosis not present

## 2024-09-15 DIAGNOSIS — I350 Nonrheumatic aortic (valve) stenosis: Secondary | ICD-10-CM | POA: Diagnosis not present

## 2024-09-15 DIAGNOSIS — E7800 Pure hypercholesterolemia, unspecified: Secondary | ICD-10-CM | POA: Diagnosis not present

## 2024-09-15 DIAGNOSIS — I6529 Occlusion and stenosis of unspecified carotid artery: Secondary | ICD-10-CM | POA: Diagnosis not present

## 2024-09-15 DIAGNOSIS — E1165 Type 2 diabetes mellitus with hyperglycemia: Secondary | ICD-10-CM | POA: Diagnosis not present

## 2024-11-05 ENCOUNTER — Encounter: Admitting: Obstetrics and Gynecology

## 2025-06-03 ENCOUNTER — Encounter: Admitting: Obstetrics and Gynecology
